# Patient Record
Sex: Female | Born: 1975 | Race: Black or African American | Hispanic: No | Smoking: Current every day smoker
Health system: Southern US, Community
[De-identification: ages and names within clinical notes are randomized; demographics above are authoritative.]

## PROBLEM LIST (undated history)

## (undated) DIAGNOSIS — Q270 Congenital absence and hypoplasia of umbilical artery: Secondary | ICD-10-CM

## (undated) DIAGNOSIS — I471 Supraventricular tachycardia, unspecified: Secondary | ICD-10-CM

## (undated) DIAGNOSIS — O24419 Gestational diabetes mellitus in pregnancy, unspecified control: Secondary | ICD-10-CM

## (undated) DIAGNOSIS — F1120 Opioid dependence, uncomplicated: Secondary | ICD-10-CM

## (undated) HISTORY — PX: NO PAST SURGERIES: SHX2092

## (undated) HISTORY — DX: Congenital absence and hypoplasia of umbilical artery: Q27.0

## (undated) HISTORY — DX: Gestational diabetes mellitus in pregnancy, unspecified control: O24.419

---

## 1999-01-21 ENCOUNTER — Emergency Department (HOSPITAL_COMMUNITY): Admission: EM | Admit: 1999-01-21 | Discharge: 1999-01-21 | Payer: Self-pay | Admitting: Emergency Medicine

## 1999-03-25 ENCOUNTER — Emergency Department (HOSPITAL_COMMUNITY): Admission: EM | Admit: 1999-03-25 | Discharge: 1999-03-25 | Payer: Self-pay | Admitting: Emergency Medicine

## 1999-03-25 ENCOUNTER — Encounter: Payer: Self-pay | Admitting: Emergency Medicine

## 1999-11-23 ENCOUNTER — Emergency Department (HOSPITAL_COMMUNITY): Admission: EM | Admit: 1999-11-23 | Discharge: 1999-11-23 | Payer: Self-pay | Admitting: Internal Medicine

## 2000-10-23 ENCOUNTER — Emergency Department (HOSPITAL_COMMUNITY): Admission: EM | Admit: 2000-10-23 | Discharge: 2000-10-23 | Payer: Self-pay | Admitting: Emergency Medicine

## 2000-10-23 ENCOUNTER — Encounter (INDEPENDENT_AMBULATORY_CARE_PROVIDER_SITE_OTHER): Payer: Self-pay

## 2000-10-23 ENCOUNTER — Encounter: Payer: Self-pay | Admitting: Emergency Medicine

## 2000-10-24 ENCOUNTER — Emergency Department (HOSPITAL_COMMUNITY): Admission: EM | Admit: 2000-10-24 | Discharge: 2000-10-24 | Payer: Self-pay | Admitting: Emergency Medicine

## 2000-10-28 ENCOUNTER — Ambulatory Visit (HOSPITAL_COMMUNITY): Admission: RE | Admit: 2000-10-28 | Discharge: 2000-10-28 | Payer: Self-pay | Admitting: Obstetrics

## 2000-10-28 ENCOUNTER — Encounter: Payer: Self-pay | Admitting: Obstetrics

## 2002-07-03 ENCOUNTER — Encounter: Payer: Self-pay | Admitting: Emergency Medicine

## 2002-07-03 ENCOUNTER — Emergency Department (HOSPITAL_COMMUNITY): Admission: EM | Admit: 2002-07-03 | Discharge: 2002-07-03 | Payer: Self-pay | Admitting: Emergency Medicine

## 2003-01-11 ENCOUNTER — Inpatient Hospital Stay (HOSPITAL_COMMUNITY): Admission: AD | Admit: 2003-01-11 | Discharge: 2003-01-11 | Payer: Self-pay | Admitting: Obstetrics

## 2003-03-12 ENCOUNTER — Inpatient Hospital Stay (HOSPITAL_COMMUNITY): Admission: AD | Admit: 2003-03-12 | Discharge: 2003-03-12 | Payer: Self-pay | Admitting: Obstetrics

## 2003-03-19 ENCOUNTER — Inpatient Hospital Stay (HOSPITAL_COMMUNITY): Admission: AD | Admit: 2003-03-19 | Discharge: 2003-03-21 | Payer: Self-pay | Admitting: Obstetrics

## 2003-11-22 ENCOUNTER — Emergency Department (HOSPITAL_COMMUNITY): Admission: EM | Admit: 2003-11-22 | Discharge: 2003-11-22 | Payer: Self-pay | Admitting: Emergency Medicine

## 2005-01-08 ENCOUNTER — Emergency Department (HOSPITAL_COMMUNITY): Admission: EM | Admit: 2005-01-08 | Discharge: 2005-01-08 | Payer: Self-pay | Admitting: Emergency Medicine

## 2005-09-27 ENCOUNTER — Emergency Department (HOSPITAL_COMMUNITY): Admission: EM | Admit: 2005-09-27 | Discharge: 2005-09-27 | Payer: Self-pay | Admitting: *Deleted

## 2006-05-03 ENCOUNTER — Emergency Department (HOSPITAL_COMMUNITY): Admission: EM | Admit: 2006-05-03 | Discharge: 2006-05-03 | Payer: Self-pay | Admitting: Emergency Medicine

## 2006-06-17 ENCOUNTER — Inpatient Hospital Stay (HOSPITAL_COMMUNITY): Admission: AD | Admit: 2006-06-17 | Discharge: 2006-06-17 | Payer: Self-pay | Admitting: Obstetrics

## 2006-06-22 ENCOUNTER — Inpatient Hospital Stay (HOSPITAL_COMMUNITY): Admission: AD | Admit: 2006-06-22 | Discharge: 2006-06-23 | Payer: Self-pay | Admitting: Obstetrics

## 2006-06-24 ENCOUNTER — Ambulatory Visit (HOSPITAL_COMMUNITY): Admission: RE | Admit: 2006-06-24 | Discharge: 2006-06-24 | Payer: Self-pay | Admitting: Obstetrics

## 2006-11-23 ENCOUNTER — Ambulatory Visit (HOSPITAL_COMMUNITY): Admission: RE | Admit: 2006-11-23 | Discharge: 2006-11-23 | Payer: Self-pay | Admitting: Obstetrics

## 2006-12-16 ENCOUNTER — Ambulatory Visit (HOSPITAL_COMMUNITY): Admission: RE | Admit: 2006-12-16 | Discharge: 2006-12-16 | Payer: Self-pay | Admitting: Obstetrics

## 2006-12-28 ENCOUNTER — Ambulatory Visit (HOSPITAL_COMMUNITY): Admission: RE | Admit: 2006-12-28 | Discharge: 2006-12-28 | Payer: Self-pay | Admitting: Obstetrics

## 2007-01-10 ENCOUNTER — Inpatient Hospital Stay (HOSPITAL_COMMUNITY): Admission: AD | Admit: 2007-01-10 | Discharge: 2007-01-10 | Payer: Self-pay | Admitting: Obstetrics

## 2007-03-03 ENCOUNTER — Inpatient Hospital Stay (HOSPITAL_COMMUNITY): Admission: AD | Admit: 2007-03-03 | Discharge: 2007-03-03 | Payer: Self-pay | Admitting: Obstetrics

## 2007-04-19 ENCOUNTER — Inpatient Hospital Stay (HOSPITAL_COMMUNITY): Admission: AD | Admit: 2007-04-19 | Discharge: 2007-04-19 | Payer: Self-pay | Admitting: Obstetrics

## 2007-05-07 ENCOUNTER — Inpatient Hospital Stay (HOSPITAL_COMMUNITY): Admission: AD | Admit: 2007-05-07 | Discharge: 2007-05-07 | Payer: Self-pay | Admitting: Obstetrics

## 2007-05-13 ENCOUNTER — Inpatient Hospital Stay (HOSPITAL_COMMUNITY): Admission: AD | Admit: 2007-05-13 | Discharge: 2007-05-13 | Payer: Self-pay | Admitting: Obstetrics

## 2007-05-24 ENCOUNTER — Inpatient Hospital Stay (HOSPITAL_COMMUNITY): Admission: AD | Admit: 2007-05-24 | Discharge: 2007-05-24 | Payer: Self-pay | Admitting: Obstetrics

## 2007-05-26 ENCOUNTER — Inpatient Hospital Stay (HOSPITAL_COMMUNITY): Admission: AD | Admit: 2007-05-26 | Discharge: 2007-05-26 | Payer: Self-pay | Admitting: Obstetrics

## 2007-05-28 ENCOUNTER — Inpatient Hospital Stay (HOSPITAL_COMMUNITY): Admission: AD | Admit: 2007-05-28 | Discharge: 2007-05-29 | Payer: Self-pay | Admitting: Obstetrics

## 2007-08-08 ENCOUNTER — Emergency Department (HOSPITAL_COMMUNITY): Admission: EM | Admit: 2007-08-08 | Discharge: 2007-08-08 | Payer: Self-pay | Admitting: Emergency Medicine

## 2007-09-26 ENCOUNTER — Emergency Department (HOSPITAL_COMMUNITY): Admission: EM | Admit: 2007-09-26 | Discharge: 2007-09-26 | Payer: Self-pay | Admitting: Emergency Medicine

## 2007-11-28 ENCOUNTER — Emergency Department (HOSPITAL_COMMUNITY): Admission: EM | Admit: 2007-11-28 | Discharge: 2007-11-28 | Payer: Self-pay | Admitting: Emergency Medicine

## 2008-01-10 ENCOUNTER — Emergency Department (HOSPITAL_COMMUNITY): Admission: EM | Admit: 2008-01-10 | Discharge: 2008-01-10 | Payer: Self-pay | Admitting: Family Medicine

## 2008-01-25 ENCOUNTER — Emergency Department (HOSPITAL_COMMUNITY): Admission: EM | Admit: 2008-01-25 | Discharge: 2008-01-25 | Payer: Self-pay | Admitting: Emergency Medicine

## 2008-03-04 ENCOUNTER — Emergency Department (HOSPITAL_COMMUNITY): Admission: EM | Admit: 2008-03-04 | Discharge: 2008-03-05 | Payer: Self-pay | Admitting: Emergency Medicine

## 2008-04-16 ENCOUNTER — Emergency Department (HOSPITAL_COMMUNITY): Admission: EM | Admit: 2008-04-16 | Discharge: 2008-04-16 | Payer: Self-pay | Admitting: Emergency Medicine

## 2008-08-12 ENCOUNTER — Emergency Department (HOSPITAL_COMMUNITY): Admission: EM | Admit: 2008-08-12 | Discharge: 2008-08-12 | Payer: Self-pay | Admitting: Emergency Medicine

## 2008-08-21 ENCOUNTER — Inpatient Hospital Stay (HOSPITAL_COMMUNITY): Admission: AD | Admit: 2008-08-21 | Discharge: 2008-08-21 | Payer: Self-pay | Admitting: Obstetrics

## 2008-10-04 ENCOUNTER — Emergency Department (HOSPITAL_COMMUNITY): Admission: EM | Admit: 2008-10-04 | Discharge: 2008-10-04 | Payer: Self-pay | Admitting: Emergency Medicine

## 2009-01-09 ENCOUNTER — Inpatient Hospital Stay (HOSPITAL_COMMUNITY): Admission: AD | Admit: 2009-01-09 | Discharge: 2009-01-10 | Payer: Self-pay | Admitting: Obstetrics

## 2009-01-22 ENCOUNTER — Inpatient Hospital Stay (HOSPITAL_COMMUNITY): Admission: AD | Admit: 2009-01-22 | Discharge: 2009-01-23 | Payer: Self-pay | Admitting: Obstetrics

## 2009-01-25 ENCOUNTER — Emergency Department (HOSPITAL_COMMUNITY): Admission: EM | Admit: 2009-01-25 | Discharge: 2009-01-25 | Payer: Self-pay | Admitting: Emergency Medicine

## 2009-04-08 ENCOUNTER — Emergency Department (HOSPITAL_COMMUNITY): Admission: EM | Admit: 2009-04-08 | Discharge: 2009-04-08 | Payer: Self-pay | Admitting: Emergency Medicine

## 2009-04-20 ENCOUNTER — Emergency Department (HOSPITAL_COMMUNITY): Admission: EM | Admit: 2009-04-20 | Discharge: 2009-04-20 | Payer: Self-pay | Admitting: Emergency Medicine

## 2009-06-02 ENCOUNTER — Emergency Department (HOSPITAL_COMMUNITY): Admission: EM | Admit: 2009-06-02 | Discharge: 2009-06-02 | Payer: Self-pay | Admitting: Family Medicine

## 2009-08-11 ENCOUNTER — Encounter: Admission: RE | Admit: 2009-08-11 | Discharge: 2009-08-11 | Payer: Self-pay | Admitting: Nephrology

## 2009-10-18 ENCOUNTER — Emergency Department (HOSPITAL_COMMUNITY): Admission: EM | Admit: 2009-10-18 | Discharge: 2009-10-18 | Payer: Self-pay | Admitting: Emergency Medicine

## 2010-02-03 IMAGING — CR DG FOOT COMPLETE 3+V*R*
3 series · 3 of 3 positions shown · non-contrast
Comparison: None

CLINICAL DATA: Fell down stairs - right foot pain

RIGHT FOOT COMPLETE - 3+ VIEW

[t foot ap right]
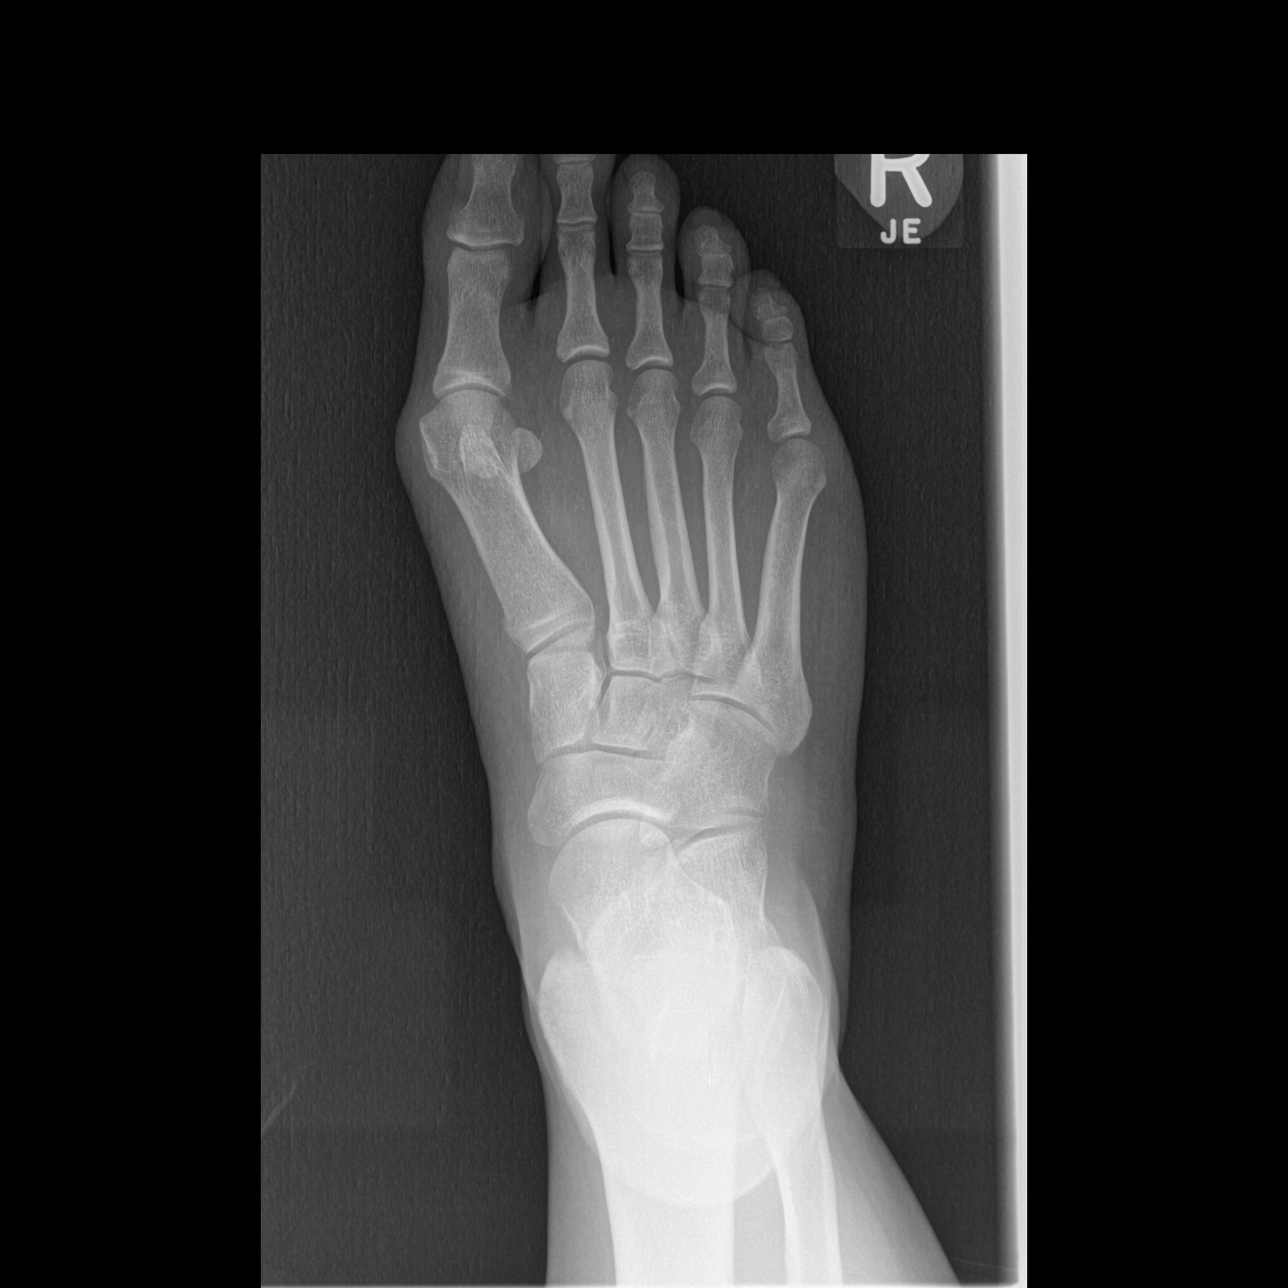

[t foot oblique right]
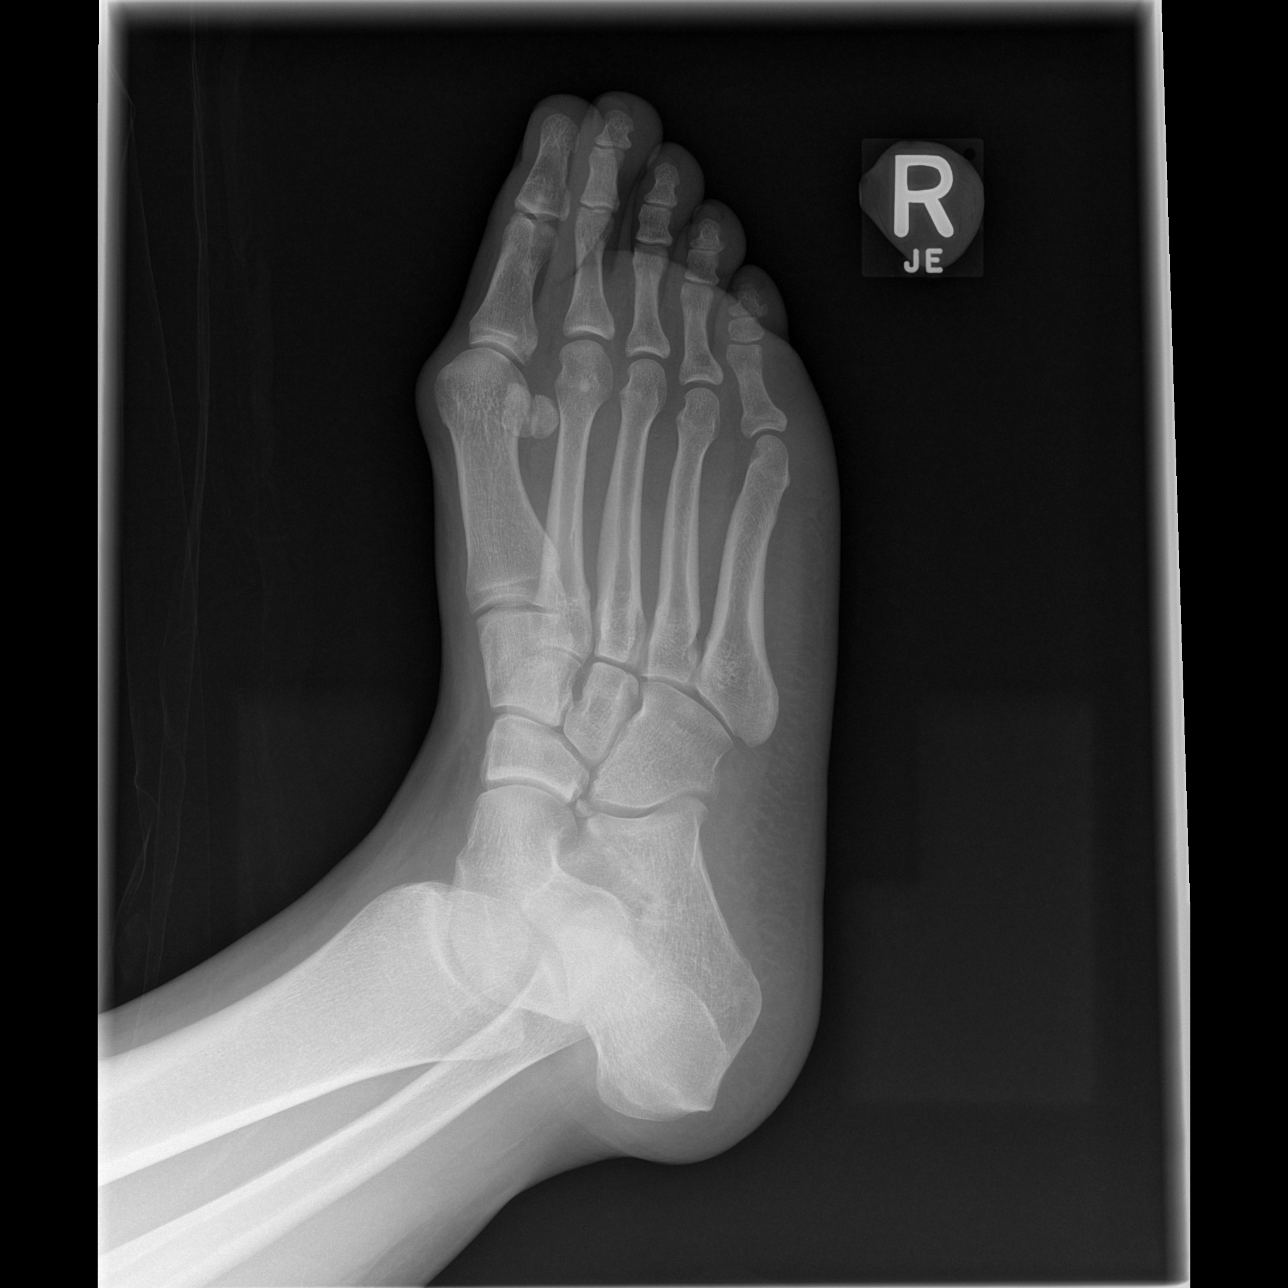

[t foot lat right]
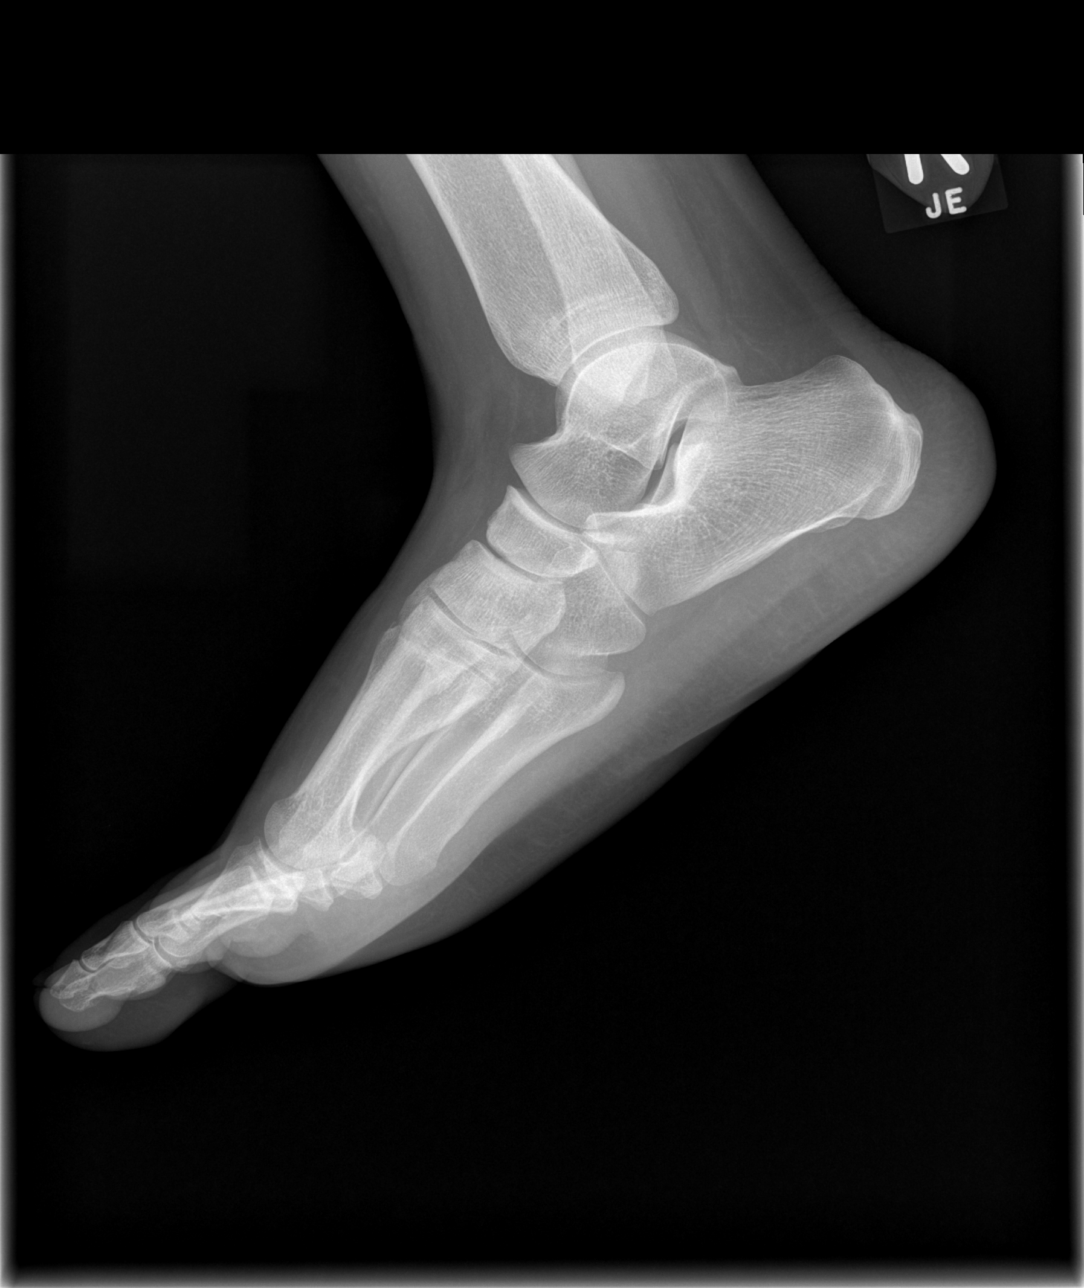

[3 of 3 positions shown; findings below may reference images not displayed]

FINDINGS: No acute abnormality.  There is a mild hallux valgus
deformity.  Soft tissues unremarkable.
IMPRESSION: No acute findings.

## 2010-07-29 ENCOUNTER — Emergency Department (HOSPITAL_COMMUNITY): Admission: EM | Admit: 2010-07-29 | Discharge: 2010-07-29 | Payer: Self-pay | Admitting: Emergency Medicine

## 2010-09-18 ENCOUNTER — Emergency Department (HOSPITAL_COMMUNITY): Admission: EM | Admit: 2010-09-18 | Discharge: 2010-09-18 | Payer: Self-pay | Admitting: Emergency Medicine

## 2011-01-10 ENCOUNTER — Encounter: Payer: Self-pay | Admitting: Obstetrics

## 2011-02-16 ENCOUNTER — Other Ambulatory Visit: Payer: Self-pay | Admitting: Family Medicine

## 2011-02-16 ENCOUNTER — Ambulatory Visit
Admission: RE | Admit: 2011-02-16 | Discharge: 2011-02-16 | Disposition: A | Discharge status: Home / Self Care | Payer: 59 | Source: Ambulatory Visit | Attending: Family Medicine | Admitting: Family Medicine

## 2011-02-16 DIAGNOSIS — J329 Chronic sinusitis, unspecified: Secondary | ICD-10-CM

## 2011-03-01 ENCOUNTER — Ambulatory Visit (HOSPITAL_BASED_OUTPATIENT_CLINIC_OR_DEPARTMENT_OTHER)
Admission: RE | Admit: 2011-03-01 | Discharge: 2011-03-01 | Disposition: A | Discharge status: Home / Self Care | Payer: 59 | Source: Ambulatory Visit | Attending: Otolaryngology | Admitting: Otolaryngology

## 2011-03-01 ENCOUNTER — Other Ambulatory Visit: Payer: Self-pay | Admitting: Otolaryngology

## 2011-03-01 DIAGNOSIS — F17200 Nicotine dependence, unspecified, uncomplicated: Secondary | ICD-10-CM | POA: Insufficient documentation

## 2011-03-01 DIAGNOSIS — J3489 Other specified disorders of nose and nasal sinuses: Secondary | ICD-10-CM | POA: Insufficient documentation

## 2011-03-01 DIAGNOSIS — F1410 Cocaine abuse, uncomplicated: Secondary | ICD-10-CM | POA: Insufficient documentation

## 2011-03-04 LAB — CBC
HCT: 40.3 % (ref 36.0–46.0)
Hemoglobin: 13.8 g/dL (ref 12.0–15.0)
MCH: 32.6 pg (ref 26.0–34.0)
MCHC: 34.2 g/dL (ref 30.0–36.0)
RDW: 15.1 % (ref 11.5–15.5)

## 2011-03-04 LAB — BASIC METABOLIC PANEL WITH GFR
BUN: 16 mg/dL (ref 6–23)
CO2: 29 meq/L (ref 19–32)
Calcium: 9.3 mg/dL (ref 8.4–10.5)
Chloride: 103 meq/L (ref 96–112)
Creatinine, Ser: 1.36 mg/dL — ABNORMAL HIGH (ref 0.4–1.2)
GFR calc non Af Amer: 45 mL/min — ABNORMAL LOW
Glucose, Bld: 96 mg/dL (ref 70–99)
Potassium: 3.5 meq/L (ref 3.5–5.1)
Sodium: 142 meq/L (ref 135–145)

## 2011-03-06 NOTE — Op Note (Signed)
  NAMESHAMRA, Yolanda Holmes                 ACCOUNT NO.:  [id number]  MEDICAL RECORD NO.:  [id number]           PATIENT TYPE:  LOCATION:                                 FACILITY:  PHYSICIAN:  Rito Lecomte H. Pollyann Kennedy, MD     DATE OF BIRTH:  12-Jan-1976  DATE OF PROCEDURE:  03/01/2011 DATE OF DISCHARGE:                              OPERATIVE REPORT   PREOPERATIVE DIAGNOSIS:  Chronic rhinitis and nasal necrosis.  POSTOPERATIVE DIAGNOSIS:  Chronic rhinitis and nasal necrosis.  PROCEDURE:  Nasal endoscopy with debridement of necrotic nasal tissue.  SURGEON:  Eliese Kerwood H. Pollyann Kennedy, MD  ANESTHESIA:  General endotracheal anesthesia was used.  COMPLICATIONS:  None.  FINDINGS:  Intense pallor of the entire nasal septum with large necrotic area of the septum, approximately 2 cm in diameter, involving the posterior quadrangular cartilage.  Behind that, there was a band of, what appeared to be, viable septum and posterior to that, there was another necrotic area with a perforation.  The inferior turbinate mucosa was completely necrosed as well.  The face of the middle turbinates was also partially necrotic and on the left side posteriorly, the nasal floor and the posterior inferior turbinate were also necrosed.  SPECIMEN TO PATHOLOGY:  Necrotic nasal septum.  BLOOD LOSS:  Minimal.  REFERRING PHYSICIAN:  Ephriam Knuckles Gurley  HISTORY:  This 35 year old went on a 7- to 20-month cocaine binge that ended in September.  Since then, she has had severe nasal and facial pain and chronic rhinitis and rhinorrhea.  Risks, benefits, alternatives, complications of the procedure were explained to the patient.  She seemed to understand and agreed to surgery.  PROCEDURE:  The patient was taken to the operating room, placed on the operative table in supine position.  Following induction of general endotracheal anesthesia, the face was prepped and draped in a standard fashion.  A 0-degree nasal endoscope was used to view the  nasal cavities bilaterally.  Suction was used to suction out debris and necrotic material.  The Wilde Blakesley forceps were used to remove large fragments of necrotic septum and other structures that were sent for pathologic evaluation.  The microdebrider was then used to clean up as much of the necrotic material as I was able to and to work my way back towards viable tissue with some actually bleeding tissue.  The above- mentioned findings were noted.  All of these areas were debrided.  A bulb syringe was then used to irrigate the saline through both sides of the nasal cavity. Fortunately, the nasal tip and supratip area still seemed to have adequate support, there was no saddle nose deformity.  The patient was then awakened, extubated, and transferred to recovery in stable condition.     [name] H. Pollyann Kennedy, MD     JHR/MEDQ  D:  03/01/2011  T:  03/02/2011  Job:  [id number]  cc:   Carolyne Fiscal  Electronically Signed by Serena Colonel MD on 03/06/2011 08:34:54 PM

## 2011-03-25 LAB — CBC
Hemoglobin: 15.8 g/dL — ABNORMAL HIGH (ref 12.0–15.0)
RBC: 4.89 MIL/uL (ref 3.87–5.11)
WBC: 12.8 10*3/uL — ABNORMAL HIGH (ref 4.0–10.5)

## 2011-03-25 LAB — DIFFERENTIAL
Eosinophils Absolute: 0.5 10*3/uL (ref 0.0–0.7)
Lymphs Abs: 3.5 10*3/uL (ref 0.7–4.0)
Monocytes Absolute: 1.1 10*3/uL — ABNORMAL HIGH (ref 0.1–1.0)
Monocytes Relative: 9 % (ref 3–12)
Neutrophils Relative %: 60 % (ref 43–77)

## 2011-03-25 LAB — CK TOTAL AND CKMB (NOT AT ARMC)
CK, MB: 2.2 ng/mL (ref 0.3–4.0)
Relative Index: 1.6 (ref 0.0–2.5)
Total CK: 134 U/L (ref 7–177)

## 2011-03-25 LAB — TROPONIN I: Troponin I: <0.01 ng/mL (ref 0.00–0.06)

## 2011-03-25 LAB — RAPID URINE DRUG SCREEN, HOSP PERFORMED
Amphetamines: NOT DETECTED
Benzodiazepines: NOT DETECTED
Tetrahydrocannabinol: NOT DETECTED

## 2011-03-25 LAB — COMPREHENSIVE METABOLIC PANEL
ALT: 31 U/L (ref 0–35)
Albumin: 4.2 g/dL (ref 3.5–5.2)
Calcium: 9.5 mg/dL (ref 8.4–10.5)
GFR calc Af Amer: >60 mL/min (ref >60)
Glucose, Bld: 120 mg/dL — ABNORMAL HIGH (ref 70–99)
Potassium: 4 mEq/L (ref 3.5–5.1)
Sodium: 137 mEq/L (ref 135–145)
Total Protein: 7.1 g/dL (ref 6.0–8.3)

## 2011-03-25 LAB — T4, FREE: Free T4: 1.57 ng/dL (ref 0.80–1.80)

## 2011-03-31 LAB — CBC
Hemoglobin: 15.1 g/dL — ABNORMAL HIGH (ref 12.0–15.0)
MCV: 97.4 fL (ref 78.0–100.0)
RBC: 4.56 MIL/uL (ref 3.87–5.11)
WBC: 8.5 10*3/uL (ref 4.0–10.5)

## 2011-03-31 LAB — COMPREHENSIVE METABOLIC PANEL
ALT: 20 U/L (ref 0–35)
AST: 17 U/L (ref 0–37)
CO2: 30 mEq/L (ref 19–32)
Chloride: 104 mEq/L (ref 96–112)
GFR calc Af Amer: >60 mL/min (ref >60)
GFR calc non Af Amer: >60 mL/min (ref >60)
Sodium: 140 mEq/L (ref 135–145)
Total Bilirubin: 0.2 mg/dL — ABNORMAL LOW (ref 0.3–1.2)

## 2011-03-31 LAB — DIFFERENTIAL
Basophils Absolute: 0 10*3/uL (ref 0.0–0.1)
Basophils Relative: 1 % (ref 0–1)
Eosinophils Absolute: 0.1 10*3/uL (ref 0.0–0.7)
Eosinophils Relative: 1 % (ref 0–5)

## 2011-03-31 LAB — RAPID URINE DRUG SCREEN, HOSP PERFORMED: Barbiturates: NOT DETECTED

## 2011-04-05 LAB — CBC
Hemoglobin: 13.1 g/dL (ref 12.0–15.0)
MCHC: 33.1 g/dL (ref 30.0–36.0)
MCV: 99.5 fL (ref 78.0–100.0)
RBC: 3.96 MIL/uL (ref 3.87–5.11)

## 2011-04-06 LAB — CBC
HCT: 38.6 % (ref 36.0–46.0)
Hemoglobin: 11.7 g/dL — ABNORMAL LOW (ref 12.0–15.0)
MCHC: 33.3 g/dL (ref 30.0–36.0)
MCV: 99.4 fL (ref 78.0–100.0)
Platelets: 276 10*3/uL (ref 150–400)
RDW: 13.4 % (ref 11.5–15.5)
WBC: 13.2 10*3/uL — ABNORMAL HIGH (ref 4.0–10.5)

## 2011-04-06 LAB — RPR: RPR Ser Ql: NONREACTIVE

## 2011-09-22 LAB — URINALYSIS, ROUTINE W REFLEX MICROSCOPIC
Bilirubin Urine: NEGATIVE
Hgb urine dipstick: NEGATIVE
Ketones, ur: 15 — AB
Protein, ur: NEGATIVE
Urobilinogen, UA: 0.2

## 2011-10-07 LAB — CBC
HCT: 36.1
Hemoglobin: 12.1
MCHC: 33.2
MCHC: 33.5
MCV: 93.4
MCV: 94.3
Platelets: 338
Platelets: 360
RBC: 3.87
RDW: 13.6
RDW: 13.6
WBC: 13.2 — ABNORMAL HIGH

## 2011-10-07 LAB — RPR
RPR Ser Ql: NONREACTIVE
RPR Ser Ql: NONREACTIVE

## 2012-01-23 ENCOUNTER — Other Ambulatory Visit: Payer: Self-pay

## 2012-01-23 ENCOUNTER — Encounter (HOSPITAL_COMMUNITY): Payer: Self-pay | Admitting: *Deleted

## 2012-01-23 ENCOUNTER — Emergency Department (HOSPITAL_COMMUNITY)
Admission: EM | Admit: 2012-01-23 | Discharge: 2012-01-23 | Disposition: A | Discharge status: Home / Self Care | Payer: Self-pay | Attending: Emergency Medicine | Admitting: Emergency Medicine

## 2012-01-23 DIAGNOSIS — I471 Supraventricular tachycardia, unspecified: Secondary | ICD-10-CM | POA: Insufficient documentation

## 2012-01-23 DIAGNOSIS — R42 Dizziness and giddiness: Secondary | ICD-10-CM | POA: Insufficient documentation

## 2012-01-23 DIAGNOSIS — F411 Generalized anxiety disorder: Secondary | ICD-10-CM | POA: Insufficient documentation

## 2012-01-23 DIAGNOSIS — F17200 Nicotine dependence, unspecified, uncomplicated: Secondary | ICD-10-CM | POA: Insufficient documentation

## 2012-01-23 DIAGNOSIS — R079 Chest pain, unspecified: Secondary | ICD-10-CM | POA: Insufficient documentation

## 2012-01-23 DIAGNOSIS — I498 Other specified cardiac arrhythmias: Secondary | ICD-10-CM | POA: Insufficient documentation

## 2012-01-23 DIAGNOSIS — R000 Tachycardia, unspecified: Secondary | ICD-10-CM

## 2012-01-23 HISTORY — DX: Supraventricular tachycardia: I47.1

## 2012-01-23 HISTORY — DX: Supraventricular tachycardia, unspecified: I47.10

## 2012-01-23 MED ORDER — ADENOSINE 6 MG/2ML IV SOLN
6.0000 mg | Freq: Once | INTRAVENOUS | Status: AC
  Administered 2012-01-23: 10:00:00 via INTRAVENOUS

## 2012-01-23 MED ORDER — METOPROLOL TARTRATE 25 MG PO TABS
50.0000 mg | ORAL_TABLET | Freq: Once | ORAL | Status: AC
  Administered 2012-01-23: 50 mg via ORAL
  Filled 2012-01-23: qty 2

## 2012-01-23 MED ORDER — SODIUM CHLORIDE 0.9 % IV SOLN: INTRAVENOUS | Status: DC

## 2012-01-23 MED ORDER — METOPROLOL SUCCINATE ER 25 MG PO TB24: 50.0000 mg | ORAL_TABLET | Freq: Every day | ORAL | Status: DC

## 2012-01-23 MED ORDER — ADENOSINE 6 MG/2ML IV SOLN
INTRAVENOUS | Status: AC
  Filled 2012-01-23: qty 6

## 2012-01-23 NOTE — ED Notes (Signed)
HR 236 at triage, hx of svt.

## 2012-01-23 NOTE — ED Provider Notes (Signed)
History     CSN: [id number]  Arrival date & time 01/23/12  0945   First MD Initiated Contact with Patient 01/23/12 1004      Chief Complaint  Patient presents with  . Irregular Heart Beat    (Consider location/radiation/quality/duration/timing/severity/associated sxs/prior treatment) The history is provided by the patient. The history is limited by the condition of the patient.   the patient is a 36 year old, female, with a history of PSVT.  She smokes cigarettes and drinks a.  When she had this morning.  She complains of sudden onset of rapid heartbeat, chest pain, lightheadedness, and shortness of breath.  She denies nausea, vomiting.  She has not had fevers, chills, or urinary tract symptoms, recently.  She tried to slow down her heart beat at home by himself but was unsuccessful so she came to the emergency room.  She is not taking any medications, and she denies allergies to medications.  Past Medical History  Diagnosis Date  . SVT (supraventricular tachycardia)     History reviewed. No pertinent past surgical history.  History reviewed. No pertinent family history.  History  Substance Use Topics  . Smoking status: Current Everyday Smoker    Types: Cigarettes  . Smokeless tobacco: Not on file  . Alcohol Use:     OB History    Grav Para Term Preterm Abortions TAB SAB Ect Mult Living                  Review of Systems  Unable to perform ROS   Allergies  Review of patient's allergies indicates no known allergies.  Home Medications  No current outpatient prescriptions on file.  BP 128/72  Pulse 239  Temp(Src) 98 F (36.7 C) (Oral)  Resp 24  SpO2 100%  Physical Exam  Constitutional: She is oriented to person, place, and time. She appears well-developed and well-nourished. She appears distressed.       Anxious and uncomfortable  HENT:  Head: Normocephalic and atraumatic.  Eyes: Pupils are equal, round, and reactive to light.  Neck: Normal range of motion.   Cardiovascular: Regular rhythm.   No murmur heard.      Tachycardia  Pulmonary/Chest: Effort normal and breath sounds normal.  Abdominal: She exhibits no distension.  Musculoskeletal: Normal range of motion. She exhibits no edema.  Neurological: She is alert and oriented to person, place, and time.  Skin: Skin is warm and dry.  Psychiatric: Her behavior is normal. Judgment and thought content normal.       Anxious    ED Course  Procedures (including critical care time) 36 year old, female, with known PSVT present PSVT, and a heart rate of 237.  We will establish an IV and they gave adenosine.  There are no indications for laboratory testing, and a chest x-ray.  This time  ED ECG REPORT   Date: 01/23/2012  EKG Time: 10:09 AM  Rate: 237  Rhythm: supraventricular tachycardia (SVT),   Axis: nl  Intervals:none  ST&T Change: nonspecific  Narrative Interpretation: psvt with nonspecific st changes  ED ECG REPORT   Date: 01/23/2012  EKG Time: 10:10 AM  Rate: 97  Rhythm: normal sinus rhythm,  normal EKG, normal sinus rhythm  Axis: nl  Intervals:none  ST&T Change: none  Narrative Interpretation: nsr following adenosine tx in ed                      Labs Reviewed - No data to display No results found.  No diagnosis found.  Feels better.  HR 110. Wants to go home.  It his her son's birthday.  MDM  psvt resolved in ed after tx with adenosine Rate controlled to sinus tach.  Advised pt to stop smoking and drinking red bull and other caffeinated beverages        Nicholes Stairs, MD 01/23/12 1044

## 2012-10-29 ENCOUNTER — Emergency Department (HOSPITAL_COMMUNITY)
Admission: EM | Admit: 2012-10-29 | Discharge: 2012-10-29 | Disposition: A | Discharge status: Home / Self Care | Payer: Medicaid Other | Source: Home / Self Care

## 2012-10-29 ENCOUNTER — Encounter (HOSPITAL_COMMUNITY): Payer: Self-pay | Admitting: *Deleted

## 2012-10-29 DIAGNOSIS — S058X9A Other injuries of unspecified eye and orbit, initial encounter: Secondary | ICD-10-CM

## 2012-10-29 DIAGNOSIS — S0502XA Injury of conjunctiva and corneal abrasion without foreign body, left eye, initial encounter: Secondary | ICD-10-CM

## 2012-10-29 HISTORY — DX: Opioid dependence, uncomplicated: F11.20

## 2012-10-29 MED ORDER — ERYTHROMYCIN 5 MG/GM OP OINT: TOPICAL_OINTMENT | Freq: Every day | OPHTHALMIC | Status: DC

## 2012-10-29 MED ORDER — TETRACAINE HCL 0.5 % OP SOLN
OPHTHALMIC | Status: AC
  Filled 2012-10-29: qty 2

## 2012-10-29 NOTE — ED Notes (Signed)
States woke up this morning with left eye watering and irritated.  As day has progressed, eye irritation is getting worse, and now she has blurred vision.  + photosensitivity with HA and nausea now.  Has not taken any meds - states she will not take any pills as she is a recovering opiate addict.  Does not wear contact lenses or glasses.

## 2012-10-29 NOTE — ED Provider Notes (Signed)
History     CSN: [id number]  Arrival date & time 10/29/12  1358   None     Chief Complaint  Patient presents with  . Eye Problem    (Consider location/radiation/quality/duration/timing/severity/associated sxs/prior treatment) HPI Comments: 36 year old female awoke this morning with a foreign body sensation in the left eye. Has been getting worse through the day and she now has blurring of vision occasionally doubling of vision states the left eye hurts especially on the upper lid. There is left eye tearing and a feeling of irritation. There is also no periorbital discomfort she describes as a headache type pain. No history of trauma.   Past Medical History  Diagnosis Date  . SVT (supraventricular tachycardia)     resolved since discontinuing opiate abuse  . Opiate addiction     History reviewed. No pertinent past surgical history.  No family history on file.  History  Substance Use Topics  . Smoking status: Current Every Day Smoker -- 1.0 packs/day    Types: Cigarettes  . Smokeless tobacco: Not on file  . Alcohol Use: No    OB History    Grav Para Term Preterm Abortions TAB SAB Ect Mult Living                  Review of Systems  Constitutional: Negative for fever, activity change and fatigue.  HENT: Negative for hearing loss, sore throat, facial swelling, rhinorrhea, neck pain, neck stiffness, postnasal drip and sinus pressure.   Eyes: Positive for photophobia, pain, discharge, redness and visual disturbance. Negative for itching.  Respiratory: Negative.   Genitourinary: Negative.     Allergies  Review of patient's allergies indicates no known allergies.  Home Medications   Current Outpatient Rx  Name  Route  Sig  Dispense  Refill  . ACETAMINOPHEN 500 MG PO TABS   Oral   Take 1,000 mg by mouth every 6 (six) hours as needed. For headache         . ERYTHROMYCIN 5 MG/GM OP OINT   Ophthalmic   Apply to eye at bedtime. Apply a 1/2 inch ribbon of ointment  to the lower lid of the L eye TID   3.5 g   0   . METOPROLOL SUCCINATE ER 25 MG PO TB24   Oral   Take 2 tablets (50 mg total) by mouth daily.   30 tablet   0     BP 125/75  Pulse 85  Temp 98.4 F (36.9 C) (Oral)  Resp 18  SpO2 100%  LMP 10/16/2012  Physical Exam  Constitutional: She is oriented to person, place, and time. She appears well-developed and well-nourished. No distress.  HENT:  Head: Normocephalic and atraumatic.  Mouth/Throat: Oropharynx is clear and moist.  Eyes: EOM are normal. Pupils are equal, round, and reactive to light.       No significant erythema of the sclera. The conjunctiva of upper and lower lid are erythematous with minor swelling of the upper lid. No particular lesion is seen externally or internally with lid everted. Funduscopic examination reveals normal blood vessels no hemorrhages or aneurysms, however, the optic disc was not well visualized.  Pulmonary/Chest: No respiratory distress.  Neurological: She is alert and oriented to person, place, and time. No cranial nerve deficit.  Skin: Skin is warm and dry.    ED Course  Irrigation Date/Time: 10/29/2012 3:15 PM Performed by: Phineas Real Zebastian Carico Authorized by: Phineas Real, Khadir Roam Consent: Verbal consent obtained. Consent given by: patient Patient understanding: patient states  understanding of the procedure being performed Patient identity confirmed: verbally with patient Local anesthesia used: yes Local anesthetic: topical anesthetic Anesthetic total: 0.02 ml Patient sedated: no Patient tolerance: Patient tolerated the procedure well with no immediate complications. Comments: After the left eye was anesthetized with 2 drops of tetracaine fluorescin dye was applied. In the eye was visualized with a blue light. A vertical and wide corneal abrasion is visible and is approximately 6 mm in length. The upper lid was everted and the lower lid lifted and no foreign body was visualized. The eye was irrigated with  90 cc of normal saline.   (including critical care time)  Labs Reviewed - No data to display No results found.   1. Corneal abrasion, left       MDM  Erythromycin ophthalmic ointment 1/2 inch ribbon of ointment applied to the left lower lid 3 times a day for 5 days. Do not use any more of the thick powder eyelash application. If not better in 2 days may return or call the ophthalmologist on call please see previous page for name and number. May return as needed          Hayden Rasmussen, NP 10/29/12 [phone]

## 2012-10-30 NOTE — ED Provider Notes (Signed)
Medical screening examination/treatment/procedure(s) were performed by resident physician or non-physician practitioner and as supervising physician I was immediately available for consultation/collaboration.   Montrice Montuori DOUGLAS MD.    Romeka Scifres D Toney Difatta, MD 10/30/12 1436 

## 2014-08-11 ENCOUNTER — Encounter (HOSPITAL_COMMUNITY): Payer: Self-pay | Admitting: Emergency Medicine

## 2014-08-11 ENCOUNTER — Emergency Department (HOSPITAL_COMMUNITY)
Admission: EM | Admit: 2014-08-11 | Discharge: 2014-08-11 | Disposition: A | Discharge status: Home / Self Care | Payer: Medicaid Other | Attending: Emergency Medicine | Admitting: Emergency Medicine

## 2014-08-11 DIAGNOSIS — T394X5A Adverse effect of antirheumatics, not elsewhere classified, initial encounter: Secondary | ICD-10-CM | POA: Insufficient documentation

## 2014-08-11 DIAGNOSIS — K2960 Other gastritis without bleeding: Secondary | ICD-10-CM | POA: Insufficient documentation

## 2014-08-11 DIAGNOSIS — M5430 Sciatica, unspecified side: Secondary | ICD-10-CM | POA: Insufficient documentation

## 2014-08-11 DIAGNOSIS — M5431 Sciatica, right side: Secondary | ICD-10-CM

## 2014-08-11 DIAGNOSIS — F17200 Nicotine dependence, unspecified, uncomplicated: Secondary | ICD-10-CM | POA: Insufficient documentation

## 2014-08-11 DIAGNOSIS — I498 Other specified cardiac arrhythmias: Secondary | ICD-10-CM | POA: Insufficient documentation

## 2014-08-11 DIAGNOSIS — Z791 Long term (current) use of non-steroidal anti-inflammatories (NSAID): Secondary | ICD-10-CM | POA: Insufficient documentation

## 2014-08-11 DIAGNOSIS — Z79899 Other long term (current) drug therapy: Secondary | ICD-10-CM | POA: Insufficient documentation

## 2014-08-11 DIAGNOSIS — T39395A Adverse effect of other nonsteroidal anti-inflammatory drugs [NSAID], initial encounter: Secondary | ICD-10-CM

## 2014-08-11 DIAGNOSIS — R1013 Epigastric pain: Secondary | ICD-10-CM | POA: Insufficient documentation

## 2014-08-11 LAB — COMPREHENSIVE METABOLIC PANEL
ALBUMIN: 3.7 g/dL (ref 3.5–5.2)
ALK PHOS: 74 U/L (ref 39–117)
ALT: 38 U/L — ABNORMAL HIGH (ref 0–35)
ANION GAP: 11 (ref 5–15)
AST: 26 U/L (ref 0–37)
BILIRUBIN TOTAL: 0.2 mg/dL — AB (ref 0.3–1.2)
BUN: 15 mg/dL (ref 6–23)
CHLORIDE: 107 meq/L (ref 96–112)
CO2: 26 meq/L (ref 19–32)
Calcium: 9.6 mg/dL (ref 8.4–10.5)
Creatinine, Ser: 0.77 mg/dL (ref 0.50–1.10)
GFR calc Af Amer: >90 mL/min (ref >90)
Glucose, Bld: 119 mg/dL — ABNORMAL HIGH (ref 70–99)
POTASSIUM: 4.9 meq/L (ref 3.7–5.3)
Sodium: 144 mEq/L (ref 137–147)
Total Protein: 7.2 g/dL (ref 6.0–8.3)

## 2014-08-11 LAB — CBC WITH DIFFERENTIAL/PLATELET
BASOS PCT: 1 % (ref 0–1)
Basophils Absolute: 0.1 10*3/uL (ref 0.0–0.1)
Eosinophils Absolute: 0.2 10*3/uL (ref 0.0–0.7)
Eosinophils Relative: 3 % (ref 0–5)
HEMATOCRIT: 37.8 % (ref 36.0–46.0)
HEMOGLOBIN: 12.7 g/dL (ref 12.0–15.0)
LYMPHS ABS: 3 10*3/uL (ref 0.7–4.0)
LYMPHS PCT: 38 % (ref 12–46)
MCH: 30.2 pg (ref 26.0–34.0)
MCHC: 33.6 g/dL (ref 30.0–36.0)
MCV: 90 fL (ref 78.0–100.0)
MONOS PCT: 10 % (ref 3–12)
Monocytes Absolute: 0.8 10*3/uL (ref 0.1–1.0)
NEUTROS ABS: 3.9 10*3/uL (ref 1.7–7.7)
NEUTROS PCT: 48 % (ref 43–77)
Platelets: 340 10*3/uL (ref 150–400)
RBC: 4.2 MIL/uL (ref 3.87–5.11)
RDW: 14.1 % (ref 11.5–15.5)
WBC: 7.9 10*3/uL (ref 4.0–10.5)

## 2014-08-11 LAB — URINALYSIS, ROUTINE W REFLEX MICROSCOPIC
BILIRUBIN URINE: NEGATIVE
Glucose, UA: NEGATIVE mg/dL
HGB URINE DIPSTICK: NEGATIVE
Ketones, ur: NEGATIVE mg/dL
Leukocytes, UA: NEGATIVE
Nitrite: NEGATIVE
PH: 5.5 (ref 5.0–8.0)
Protein, ur: NEGATIVE mg/dL
SPECIFIC GRAVITY, URINE: 1.005 (ref 1.005–1.030)
UROBILINOGEN UA: 0.2 mg/dL (ref 0.0–1.0)

## 2014-08-11 LAB — LIPASE, BLOOD: Lipase: 33 U/L (ref 11–59)

## 2014-08-11 MED ORDER — PANTOPRAZOLE SODIUM 40 MG IV SOLR: 40.0000 mg | Freq: Once | INTRAVENOUS | Status: DC

## 2014-08-11 MED ORDER — OMEPRAZOLE 20 MG PO CPDR: 20.0000 mg | DELAYED_RELEASE_CAPSULE | Freq: Every day | ORAL | Status: DC

## 2014-08-11 MED ORDER — GI COCKTAIL ~~LOC~~
30.0000 mL | Freq: Once | ORAL | Status: AC
  Administered 2014-08-11: 30 mL via ORAL
  Filled 2014-08-11: qty 30

## 2014-08-11 MED ORDER — PREDNISONE 20 MG PO TABS: ORAL_TABLET | ORAL | Status: DC

## 2014-08-11 NOTE — Discharge Instructions (Signed)
Sciatica °Sciatica is pain, weakness, numbness, or tingling along the path of the sciatic nerve. The nerve starts in the lower back and runs down the back of each leg. The nerve controls the muscles in the lower leg and in the back of the knee, while also providing sensation to the back of the thigh, lower leg, and the sole of your foot. Sciatica is a symptom of another medical condition. For instance, nerve damage or certain conditions, such as a herniated disk or bone spur on the spine, pinch or put pressure on the sciatic nerve. This causes the pain, weakness, or other sensations normally associated with sciatica. Generally, sciatica only affects one side of the body. °CAUSES  °· Herniated or slipped disc. °· Degenerative disk disease. °· A pain disorder involving the narrow muscle in the buttocks (piriformis syndrome). °· Pelvic injury or fracture. °· Pregnancy. °· Tumor (rare). °SYMPTOMS  °Symptoms can vary from mild to very severe. The symptoms usually travel from the low back to the buttocks and down the back of the leg. Symptoms can include: °· Mild tingling or dull aches in the lower back, leg, or hip. °· Numbness in the back of the calf or sole of the foot. °· Burning sensations in the lower back, leg, or hip. °· Sharp pains in the lower back, leg, or hip. °· Leg weakness. °· Severe back pain inhibiting movement. °These symptoms may get worse with coughing, sneezing, laughing, or prolonged sitting or standing. Also, being overweight may worsen symptoms. °DIAGNOSIS  °Your caregiver will perform a physical exam to look for common symptoms of sciatica. He or she may ask you to do certain movements or activities that would trigger sciatic nerve pain. Other tests may be performed to find the cause of the sciatica. These may include: °· Blood tests. °· X-rays. °· Imaging tests, such as an MRI or CT scan. °TREATMENT  °Treatment is directed at the cause of the sciatic pain. Sometimes, treatment is not necessary  and the pain and discomfort goes away on its own. If treatment is needed, your caregiver may suggest: °· Over-the-counter medicines to relieve pain. °· Prescription medicines, such as anti-inflammatory medicine, muscle relaxants, or narcotics. °· Applying heat or ice to the painful area. °· Steroid injections to lessen pain, irritation, and inflammation around the nerve. °· Reducing activity during periods of pain. °· Exercising and stretching to strengthen your abdomen and improve flexibility of your spine. Your caregiver may suggest losing weight if the extra weight makes the back pain worse. °· Physical therapy. °· Surgery to eliminate what is pressing or pinching the nerve, such as a bone spur or part of a herniated disk. °HOME CARE INSTRUCTIONS  °· Only take over-the-counter or prescription medicines for pain or discomfort as directed by your caregiver. °· Apply ice to the affected area for 20 minutes, 3-4 times a day for the first 48-72 hours. Then try heat in the same way. °· Exercise, stretch, or perform your usual activities if these do not aggravate your pain. °· Attend physical therapy sessions as directed by your caregiver. °· Keep all follow-up appointments as directed by your caregiver. °· Do not wear high heels or shoes that do not provide proper support. °· Check your mattress to see if it is too soft. A firm mattress may lessen your pain and discomfort. °SEEK IMMEDIATE MEDICAL CARE IF:  °· You lose control of your bowel or bladder (incontinence). °· You have increasing weakness in the lower back, pelvis, buttocks,   or legs.  You have redness or swelling of your back.  You have a burning sensation when you urinate.  You have pain that gets worse when you lie down or awakens you at night.  Your pain is worse than you have experienced in the past.  Your pain is lasting longer than 4 weeks.  You are suddenly losing weight without reason. MAKE SURE YOU:  Understand these  instructions.  Will watch your condition.  Will get help right away if you are not doing well or get worse. Document Released: 11/30/2001 Document Revised: 06/06/2012 Document Reviewed: 04/16/2012 [facility] Patient Information 2015 [place], [place]. This information is not intended to replace advice given to you by your health care provider. Make sure you discuss any questions you have with your health care provider.   Gastritis, Adult Gastritis is soreness and swelling (inflammation) of the lining of the stomach. Gastritis can develop as a sudden onset (acute) or long-term (chronic) condition. If gastritis is not treated, it can lead to stomach bleeding and ulcers. CAUSES  Gastritis occurs when the stomach lining is weak or damaged. Digestive juices from the stomach then inflame the weakened stomach lining. The stomach lining may be weak or damaged due to viral or bacterial infections. One common bacterial infection is the Helicobacter pylori infection. Gastritis can also result from excessive alcohol consumption, taking certain medicines, or having too much acid in the stomach.  SYMPTOMS  In some cases, there are no symptoms. When symptoms are present, they may include:  Pain or a burning sensation in the upper abdomen.  Nausea.  Vomiting.  An uncomfortable feeling of fullness after eating. DIAGNOSIS  Your caregiver may suspect you have gastritis based on your symptoms and a physical exam. To determine the cause of your gastritis, your caregiver may perform the following:  Blood or stool tests to check for the H pylori bacterium.  Gastroscopy. A thin, flexible tube (endoscope) is passed down the esophagus and into the stomach. The endoscope has a light and camera on the end. Your caregiver uses the endoscope to view the inside of the stomach.  Taking a tissue sample (biopsy) from the stomach to examine under a microscope. TREATMENT  Depending on the cause of your gastritis, medicines  may be prescribed. If you have a bacterial infection, such as an H pylori infection, antibiotics may be given. If your gastritis is caused by too much acid in the stomach, H2 blockers or antacids may be given. Your caregiver may recommend that you stop taking aspirin, ibuprofen, or other nonsteroidal anti-inflammatory drugs (NSAIDs). HOME CARE INSTRUCTIONS  Only take over-the-counter or prescription medicines as directed by your caregiver.  If you were given antibiotic medicines, take them as directed. Finish them even if you start to feel better.  Drink enough fluids to keep your urine clear or pale yellow.  Avoid foods and drinks that make your symptoms worse, such as:  Caffeine or alcoholic drinks.  Chocolate.  Peppermint or mint flavorings.  Garlic and onions.  Spicy foods.  Citrus fruits, such as oranges, lemons, or limes.  Tomato-based foods such as sauce, chili, salsa, and pizza.  Fried and fatty foods.  Eat small, frequent meals instead of large meals. SEEK IMMEDIATE MEDICAL CARE IF:   You have black or dark red stools.  You vomit blood or material that looks like coffee grounds.  You are unable to keep fluids down.  Your abdominal pain gets worse.  You have a fever.  You do not  feel better after 1 week.  You have any other questions or concerns. MAKE SURE YOU:  Understand these instructions.  Will watch your condition.  Will get help right away if you are not doing well or get worse. Document Released: 11/30/2001 Document Revised: 06/06/2012 Document Reviewed: 01/19/2012 [facility] Patient Information 2015 [place], [place]. This information is not intended to replace advice given to you by your health care provider. Make sure you discuss any questions you have with your health care provider.

## 2014-08-11 NOTE — ED Provider Notes (Signed)
CSN: [id number]     Arrival date & time 08/11/14  0865 History   First MD Initiated Contact with Patient 08/11/14 [phone]     Chief Complaint  Patient presents with  . Flank Pain     (Consider location/radiation/quality/duration/timing/severity/associated sxs/prior Treatment) HPI Yolanda Holmes is a 38 year old female with past medical history of an opiate addiction who presents to the ER today complaining of back pain. Patient states her pain was acute in onset, and she states she recalls "waking up one morning with the pain". She denies trauma to the area.  Patient states her pain is in her lower lumbar region and radiates to her right gluteal region. Patient states the pain is a sharp pain which is intermittent, is made worse with walking, movement. Patient states when moving around her pain is approximately a 6/10, however when sitting still her pain is a 0/10. Patient states she has tried taking 3 ibuprofen in 3 only use every morning for her pain. Patient denies numbness, tingling, saddle anesthesia, weakness, fever, history of cancer, history of IV drug use. Patient states her opiate addiction was involving prescription narcotics.  Past Medical History  Diagnosis Date  . SVT (supraventricular tachycardia)     resolved since discontinuing opiate abuse  . Opiate addiction    No past surgical history on file. No family history on file. History  Substance Use Topics  . Smoking status: Current Every Day Smoker -- 1.00 packs/day    Types: Cigarettes  . Smokeless tobacco: Not on file  . Alcohol Use: No   OB History   Grav Para Term Preterm Abortions TAB SAB Ect Mult Living                 Review of Systems  Constitutional: Negative for fever.  HENT: Negative for trouble swallowing.   Eyes: Negative for visual disturbance.  Respiratory: Negative for shortness of breath.   Cardiovascular: Negative for chest pain.  Gastrointestinal: Negative for nausea, vomiting and abdominal pain.   Endocrine: Negative for polyuria.  Genitourinary: Negative for dysuria, urgency, frequency, difficulty urinating and pelvic pain.  Musculoskeletal: Positive for back pain. Negative for arthralgias, gait problem, joint swelling, myalgias and neck pain.  Skin: Negative for rash.  Neurological: Negative for dizziness, weakness and numbness.  Psychiatric/Behavioral: Negative.       Allergies  Review of patient's allergies indicates no known allergies.  Home Medications   Prior to Admission medications   Medication Sig Start Date End Date Taking? Authorizing Provider  ibuprofen (ADVIL,MOTRIN) 200 MG tablet Take 600 mg by mouth every 6 (six) hours as needed for moderate pain.   Yes Historical Provider, MD  naproxen sodium (ANAPROX) 220 MG tablet Take 220 mg by mouth 2 (two) times daily with a meal.   Yes Historical Provider, MD  metoprolol succinate (TOPROL-XL) 25 MG 24 hr tablet Take 50 mg by mouth daily.    Historical Provider, MD  omeprazole (PRILOSEC) 20 MG capsule Take 1 capsule (20 mg total) by mouth daily. 08/11/14   Monte Fantasia, PA-C  predniSONE (DELTASONE) 20 MG tablet 3 tabs po day one, then 2 po daily x 4 days 08/11/14   Monte Fantasia, PA-C   BP 132/86  Pulse 41  Temp(Src) 97.9 F (36.6 C) (Oral)  Resp 18  SpO2 100%  LMP 08/07/2014 Physical Exam  Constitutional: She is oriented to person, place, and time. She appears well-developed and well-nourished. No distress.  HENT:  Head: Normocephalic and atraumatic.  Mouth/Throat: Oropharynx  is clear and moist. No oropharyngeal exudate.  Eyes: Right eye exhibits no discharge. Left eye exhibits no discharge. No scleral icterus.  Neck: Normal range of motion.  Cardiovascular: Normal rate, regular rhythm and normal heart sounds.   No murmur heard. Pulmonary/Chest: Effort normal and breath sounds normal. No respiratory distress.  Abdominal: Soft. Normal appearance and bowel sounds are normal. There is tenderness in the epigastric  area.  Musculoskeletal: Normal range of motion. She exhibits no edema and no tenderness.  Patient has tenderness from her L3 to her S1/S2 region. No obvious deformities, step off, erythema, ecchymosis, trauma noted to the region.    Neurological: She is alert and oriented to person, place, and time. She has normal strength and normal reflexes. No cranial nerve deficit or sensory deficit. She displays a negative Romberg sign. Coordination and gait normal.  Motor strength 5 out of 5 in all major muscle groups of upper and lower extremities. Straight leg raise positive on right lower, negative on the left lower extremity.   Skin: Skin is warm and dry. No rash noted. She is not diaphoretic.  Psychiatric: She has a normal mood and affect.    ED Course  Procedures (including critical care time) Labs Review Labs Reviewed  COMPREHENSIVE METABOLIC PANEL - Abnormal; Notable for the following:    Glucose, Bld 119 (*)    ALT 38 (*)    Total Bilirubin 0.2 (*)    All other components within normal limits  URINE CULTURE  CBC WITH DIFFERENTIAL  LIPASE, BLOOD  URINALYSIS, ROUTINE W REFLEX MICROSCOPIC    Imaging Review No results found.   EKG Interpretation None      MDM   Final diagnoses:  Sciatica associated with disorder of lumbar spine, right  Gastritis due to nonsteroidal anti-inflammatory drug (NSAID)   38 year old female here with 6 weeks of worsening back pain with signs and symptoms of radiculopathy to her right buttock region concerning for sciatic pain. Patient stating she is taking 3 ibuprofen along with 3 Aleve every morning before she goes to work for her pain. She states this medicine helps, however the pain returns and she does not use any other medicine throughout the day. Physical exam consistent with a lumbar radiculopathy. Also note patient was extremely tender in her epigastric region. Patient initially denies knowing about this abdominal pain, however speaking with her more  extensively she states his abdominal pain has been present approximately the same time that her back pain has been present. She states she was hesitant to bring upper abdominal pain due to the fact that she has had a history of opioid dependence and does not wish to be prescribed any opiate medications for pain. She states she is hesitant about having any workup due to the fact that she is afraid of relapsing into chemical dependence. I  offered patient a GI cocktail to determine if her abdominal pain was coming from and excessive use of NSAIDs and advised her that this should not affect her tendency for dependence on medication. Further workup with CBC, CMP, UA, lipase, urine culture.  12:20 PM: Patient's pain down to a 0/10 in her abdomen after GI cocktail with no tenderness noted on reexamination. Labs unremarkable at this point. Due to the fact the patient's pain her abdomen was held with the GI cocktail and labs are unremarkable , we will discharge patient at this point. I spoke to patient at length about proper use of ibuprofen, and how this may have been causing  some gastritis symptoms. We'll give patient a PPI to take daily, and a steroid Dosepak to help patient back pain. I also educated her on use of RICE. I strongly encouraged patient find a PCP in the area, and patient agreed that this would be a good plan to help followup and insure resolution of her symptoms in the near future. I encouraged patient to call or return to the ER should she have any questions or concerns.   Signed,  Ladona Mow, PA-C 5:21 PM   This patient seen and discussed with Dr. Toy Cookey, M.D.  Monte Fantasia, PA-C 08/11/14 1722

## 2014-08-11 NOTE — ED Notes (Signed)
She c/o right lat. Thoracic/low back/flank area pain x 6-8 weeks.  She is in no distress.

## 2014-08-12 LAB — URINE CULTURE

## 2014-08-13 NOTE — ED Provider Notes (Signed)
Medical screening examination/treatment/procedure(s) were conducted as a shared visit with non-physician practitioner(s) and myself.  I personally evaluated the patient during the encounter. Pt presents w/ R low back/SI joint pain worsen w/ mvmt, better when still. She has been taking large amts of NSAIDs for this. No numbness, weakness, fevers, bowel or bladder incontinence. She had epigastric & RUQ ttp on abdominal exam w/o rebound or guarding, but had not been complaining of ab pain. Cardiopulm exam benign. Screening labs unremarkable. Suspect gastritis due to large amts of NSAIDs. Pain resolved after GI cocktail. Will place on PPI, short course of steroids and instruct on more appropriate use of NSAIDS. Opiates will not be given due to hx of addiction.   EKG Interpretation None        Toy Cookey, MD 08/13/14 1133

## 2015-08-15 ENCOUNTER — Emergency Department (HOSPITAL_COMMUNITY): Payer: Medicaid Other

## 2015-08-15 ENCOUNTER — Emergency Department (HOSPITAL_COMMUNITY)
Admission: EM | Admit: 2015-08-15 | Discharge: 2015-08-15 | Disposition: A | Discharge status: Home / Self Care | Payer: Medicaid Other | Attending: Emergency Medicine | Admitting: Emergency Medicine

## 2015-08-15 ENCOUNTER — Encounter (HOSPITAL_COMMUNITY): Payer: Self-pay | Admitting: *Deleted

## 2015-08-15 DIAGNOSIS — Z8639 Personal history of other endocrine, nutritional and metabolic disease: Secondary | ICD-10-CM | POA: Insufficient documentation

## 2015-08-15 DIAGNOSIS — Z720 Tobacco use: Secondary | ICD-10-CM | POA: Insufficient documentation

## 2015-08-15 DIAGNOSIS — M10072 Idiopathic gout, left ankle and foot: Secondary | ICD-10-CM | POA: Insufficient documentation

## 2015-08-15 DIAGNOSIS — Z79899 Other long term (current) drug therapy: Secondary | ICD-10-CM | POA: Insufficient documentation

## 2015-08-15 DIAGNOSIS — M109 Gout, unspecified: Secondary | ICD-10-CM

## 2015-08-15 MED ORDER — IBUPROFEN 800 MG PO TABS: 800.0000 mg | ORAL_TABLET | Freq: Three times a day (TID) | ORAL | Status: DC | PRN

## 2015-08-15 MED ORDER — PREDNISONE 50 MG PO TABS: 50.0000 mg | ORAL_TABLET | Freq: Every day | ORAL | Status: DC

## 2015-08-15 NOTE — ED Notes (Signed)
Pt reports pain and swelling to left big toe x 1 week, denies injury. Ambulatory at triage.

## 2015-08-15 NOTE — ED Provider Notes (Signed)
CSN: [id number]     Arrival date & time 08/15/15  1347 History  This chart was scribed for non-physician practitioner Ebbie Ridge, PA-C working with Gwyneth Sprout, MD by Lyndel Safe, ED Scribe. This patient was seen in room TR06C/TR06C and the patient's care was started at 2:18 PM.    Chief Complaint  Patient presents with  . Foot Pain   The history is provided by the patient. No language interpreter was used.    HPI Comments: Yolanda Holmes is a 39 y.o. female, with no significant PMhx, who presents to the Emergency Department complaining of constant, moderate pain to the medial aspect of left foot onset 8 days ago. Pt does not note an injury attributable to her pain but states she stands for the majority of her workday. Her pain is exacerbated with weight bearing and pressure to the area. Pt has applied ice and heat to the affected area with no relief. Denies pain to the sole of her foot or any wound to the area.   Past Medical History  Diagnosis Date  . SVT (supraventricular tachycardia)     resolved since discontinuing opiate abuse  . Opiate addiction    History reviewed. No pertinent past surgical history. History reviewed. No pertinent family history. Social History  Substance Use Topics  . Smoking status: Current Every Day Smoker -- 1.00 packs/day    Types: Cigarettes  . Smokeless tobacco: None  . Alcohol Use: No   OB History    No data available     Review of Systems  Musculoskeletal: Positive for arthralgias ( left foot).  Skin: Negative for wound.  All other systems reviewed and are negative.  Allergies  Review of patient's allergies indicates no known allergies.  Home Medications   Prior to Admission medications   Medication Sig Start Date End Date Taking? Authorizing Provider  ibuprofen (ADVIL,MOTRIN) 200 MG tablet Take 600 mg by mouth every 6 (six) hours as needed for moderate pain.    Historical Provider, MD  metoprolol succinate (TOPROL-XL) 25 MG 24 hr  tablet Take 50 mg by mouth daily.    Historical Provider, MD  naproxen sodium (ANAPROX) 220 MG tablet Take 220 mg by mouth 2 (two) times daily with a meal.    Historical Provider, MD  omeprazole (PRILOSEC) 20 MG capsule Take 1 capsule (20 mg total) by mouth daily. 08/11/14   Ladona Mow, PA-C  predniSONE (DELTASONE) 20 MG tablet 3 tabs po day one, then 2 po daily x 4 days 08/11/14   Ladona Mow, PA-C   BP 118/80 mmHg  Pulse 92  Temp(Src) 98.6 F (37 C) (Oral)  Resp 18  SpO2 98%  LMP 07/25/2015 Physical Exam  Constitutional: She is oriented to person, place, and time. She appears well-developed and well-nourished.  HENT:  Head: Normocephalic and atraumatic.  Eyes: Pupils are equal, round, and reactive to light.  Pulmonary/Chest: Effort normal.  Musculoskeletal:       Left foot: There is tenderness.       Feet:  Neurological: She is alert and oriented to person, place, and time.  Skin: Skin is warm and dry. No erythema.  Nursing note and vitals reviewed.   ED Course  Procedures  DIAGNOSTIC STUDIES: Oxygen Saturation is 98% on RA, normal by my interpretation.    COORDINATION OF CARE: 2:22 PM Discussed treatment plan which includes to order Xray imaging of left foot with pt. Will give orthopedic referral and advised pt to ice affected area. Pt acknowledges and  agrees to plan.   Labs Review Labs Reviewed - No data to display  Imaging Review No results found. I have personally reviewed and evaluated these images and lab results as part of my medical decision-making. I feel that the patient has gout and she will need follow-up with orthopedics.  Patient agrees the plan and all questions were answered I personally performed the services described in this documentation, which was scribed in my presence. The recorded information has been reviewed and is accurate.    Charlestine Night, PA-C 08/18/15 1610  Gwyneth Sprout, MD 08/18/15 1700

## 2015-08-15 NOTE — Discharge Instructions (Signed)
Return here as needed.  Follow-up with the clinic provided.  Ice and elevate your foot

## 2015-09-13 ENCOUNTER — Emergency Department (HOSPITAL_COMMUNITY): Payer: Medicaid Other

## 2015-09-13 ENCOUNTER — Encounter (HOSPITAL_COMMUNITY): Payer: Self-pay

## 2015-09-13 ENCOUNTER — Emergency Department (HOSPITAL_COMMUNITY)
Admission: EM | Admit: 2015-09-13 | Discharge: 2015-09-13 | Disposition: A | Discharge status: Home / Self Care | Payer: Medicaid Other | Attending: Emergency Medicine | Admitting: Emergency Medicine

## 2015-09-13 DIAGNOSIS — Z720 Tobacco use: Secondary | ICD-10-CM | POA: Insufficient documentation

## 2015-09-13 DIAGNOSIS — J069 Acute upper respiratory infection, unspecified: Secondary | ICD-10-CM | POA: Insufficient documentation

## 2015-09-13 DIAGNOSIS — Z791 Long term (current) use of non-steroidal anti-inflammatories (NSAID): Secondary | ICD-10-CM | POA: Insufficient documentation

## 2015-09-13 DIAGNOSIS — Z3202 Encounter for pregnancy test, result negative: Secondary | ICD-10-CM | POA: Insufficient documentation

## 2015-09-13 DIAGNOSIS — H9202 Otalgia, left ear: Secondary | ICD-10-CM | POA: Insufficient documentation

## 2015-09-13 DIAGNOSIS — R1110 Vomiting, unspecified: Secondary | ICD-10-CM | POA: Insufficient documentation

## 2015-09-13 DIAGNOSIS — Z7952 Long term (current) use of systemic steroids: Secondary | ICD-10-CM | POA: Insufficient documentation

## 2015-09-13 DIAGNOSIS — J40 Bronchitis, not specified as acute or chronic: Secondary | ICD-10-CM

## 2015-09-13 DIAGNOSIS — Z79899 Other long term (current) drug therapy: Secondary | ICD-10-CM | POA: Insufficient documentation

## 2015-09-13 LAB — POC URINE PREG, ED: PREG TEST UR: NEGATIVE

## 2015-09-13 MED ORDER — BENZONATATE 100 MG PO CAPS
ORAL_CAPSULE | ORAL | Status: AC
  Administered 2015-09-13: 09:00:00
  Filled 2015-09-13: qty 1

## 2015-09-13 MED ORDER — BENZONATATE 100 MG PO CAPS
100.0000 mg | ORAL_CAPSULE | Freq: Once | ORAL | Status: AC
  Administered 2015-09-13: 100 mg via ORAL
  Filled 2015-09-13: qty 1

## 2015-09-13 MED ORDER — BENZONATATE 100 MG PO CAPS: 100.0000 mg | ORAL_CAPSULE | Freq: Three times a day (TID) | ORAL | Status: DC

## 2015-09-13 MED ORDER — ALBUTEROL SULFATE HFA 108 (90 BASE) MCG/ACT IN AERS
2.0000 | INHALATION_SPRAY | Freq: Once | RESPIRATORY_TRACT | Status: AC
  Administered 2015-09-13: 2 via RESPIRATORY_TRACT
  Filled 2015-09-13: qty 6.7

## 2015-09-13 MED ORDER — DEXAMETHASONE 4 MG PO TABS
10.0000 mg | ORAL_TABLET | Freq: Once | ORAL | Status: AC
  Administered 2015-09-13: 10 mg via ORAL
  Filled 2015-09-13: qty 2
  Filled 2015-09-13: qty 1

## 2015-09-13 MED ORDER — IBUPROFEN 800 MG PO TABS: 800.0000 mg | ORAL_TABLET | Freq: Once | ORAL | Status: DC

## 2015-09-13 MED ORDER — ALBUTEROL SULFATE HFA 108 (90 BASE) MCG/ACT IN AERS: 2.0000 | INHALATION_SPRAY | RESPIRATORY_TRACT | Status: DC | PRN

## 2015-09-13 MED ORDER — NAPROXEN 500 MG PO TABS: 500.0000 mg | ORAL_TABLET | Freq: Two times a day (BID) | ORAL | Status: DC

## 2015-09-13 NOTE — ED Provider Notes (Addendum)
CSN: [id number]     Arrival date & time 09/13/15  0751 History   First MD Initiated Contact with Patient 09/13/15 0759     Chief Complaint  Patient presents with  . URI     (Consider location/radiation/quality/duration/timing/severity/associated sxs/prior Treatment) HPI Comments: Productive yellow sputum Quit smoking 5 days ago Mom has hx of asthma No hx of asthma/wheezing  Patient is a 39 y.o. female presenting with URI.  URI Presenting symptoms: congestion, cough, ear pain (left) and sore throat   Presenting symptoms: no fever   Severity:  Moderate Onset quality:  Gradual Duration:  2 weeks Timing:  Constant Progression:  Worsening Chronicity:  New Relieved by:  Nothing Worsened by:  Certain positions Ineffective treatments: tylenol cold and flu, delsum. Associated symptoms: wheezing   Associated symptoms: no headaches and no neck pain     Past Medical History  Diagnosis Date  . SVT (supraventricular tachycardia)     resolved since discontinuing opiate abuse  . Opiate addiction    No past surgical history on file. No family history on file. Social History  Substance Use Topics  . Smoking status: Current Every Day Smoker -- 1.00 packs/day    Types: Cigarettes  . Smokeless tobacco: None  . Alcohol Use: No   OB History    No data available     Review of Systems  Constitutional: Negative for fever, chills and appetite change.  HENT: Positive for congestion, ear pain (left) and sore throat.   Eyes: Negative for visual disturbance.  Respiratory: Positive for cough, shortness of breath and wheezing.   Cardiovascular: Positive for chest pain.  Gastrointestinal: Positive for vomiting (sometimes post-tussive). Negative for nausea, abdominal pain and diarrhea.  Genitourinary: Negative for difficulty urinating.  Musculoskeletal: Negative for back pain and neck pain.  Skin: Negative for rash.  Neurological: Negative for syncope and headaches.      Allergies   Review of patient's allergies indicates no known allergies.  Home Medications   Prior to Admission medications   Medication Sig Start Date End Date Taking? Authorizing Provider  albuterol (PROVENTIL HFA;VENTOLIN HFA) 108 (90 BASE) MCG/ACT inhaler Inhale 2 puffs into the lungs every 4 (four) hours as needed for wheezing or shortness of breath. 09/13/15   Alvira Monday, MD  benzonatate (TESSALON) 100 MG capsule Take 1 capsule (100 mg total) by mouth every 8 (eight) hours. 09/13/15   Alvira Monday, MD  ibuprofen (ADVIL,MOTRIN) 800 MG tablet Take 1 tablet (800 mg total) by mouth every 8 (eight) hours as needed. 08/15/15   Charlestine Night, PA-C  metoprolol succinate (TOPROL-XL) 25 MG 24 hr tablet Take 50 mg by mouth daily.    Historical Provider, MD  naproxen (NAPROSYN) 500 MG tablet Take 1 tablet (500 mg total) by mouth 2 (two) times daily. 09/13/15   Alvira Monday, MD  naproxen sodium (ANAPROX) 220 MG tablet Take 220 mg by mouth 2 (two) times daily with a meal.    Historical Provider, MD  omeprazole (PRILOSEC) 20 MG capsule Take 1 capsule (20 mg total) by mouth daily. 08/11/14   Ladona Mow, PA-C  predniSONE (DELTASONE) 50 MG tablet Take 1 tablet (50 mg total) by mouth daily with breakfast. 08/15/15   Charlestine Night, PA-C   BP 114/78 mmHg  Pulse 72  Temp(Src) 98.1 F (36.7 C) (Oral)  Resp 18  SpO2 100%  LMP 08/29/2015 (Approximate) Physical Exam  Constitutional: She is oriented to person, place, and time. She appears well-developed and well-nourished. No distress.  Frequent coughing  HENT:  Head: Normocephalic and atraumatic.  Eyes: Conjunctivae and EOM are normal.  Neck: Normal range of motion.  Cardiovascular: Normal rate, regular rhythm, normal heart sounds and intact distal pulses.  Exam reveals no gallop and no friction rub.   No murmur heard. Pulmonary/Chest: Effort normal. No respiratory distress. She has wheezes (occasional end expiratory wheeze with forced expiration). She  has no rales.  Abdominal: Soft. She exhibits no distension. There is no tenderness. There is no guarding.  Musculoskeletal: She exhibits no edema or tenderness.  Neurological: She is alert and oriented to person, place, and time.  Skin: Skin is warm and dry. No rash noted. She is not diaphoretic. No erythema.  Nursing note and vitals reviewed.   ED Course  Procedures (including critical care time) Labs Review Labs Reviewed  POC URINE PREG, ED    Imaging Review Dg Chest 2 View  09/13/2015   CLINICAL DATA:  Cough, shortness of breath, chest pain  EXAM: CHEST  2 VIEW  COMPARISON:  10/18/2009  FINDINGS: Lungs are clear.  No pleural effusion or pneumothorax.  The heart is normal in size.  Visualized osseous structures are within normal limits.  IMPRESSION: Normal chest radiographs.   Electronically Signed   By: Charline Bills M.D.   On: 09/13/2015 08:43   I have personally reviewed and evaluated these images and lab results as part of my medical decision-making.   EKG Interpretation   Date/Time:  Saturday September 13 2015 08:21:57 EDT Ventricular Rate:  70 PR Interval:  140 QRS Duration: 88 QT Interval:  368 QTC Calculation: 397 R Axis:   73 Text Interpretation:  Sinus rhythm Nonspecific TW abnormality Since last  tracing, patient is no longer in SVT Confirmed by [facility] MD, ERIN  ([id number]) on 09/13/2015 9:10:18 AM      MDM   Final diagnoses:  URI (upper respiratory infection)  Bronchitis   39 year old female with remote history of opiate abuse, history of SVT, quit smoking 5 days ago, presents with concern for cough, wheezing, shortness of breath and sore throat.  Chest x-ray shows no signs of pneumonia.  Patient with occasional end expiratory wheeze with forced expiration. No signs of peritonsillar abscess, epiglottitis or RPA. Pertinent to test negative. Patient reports chest pain, an EKG was done which showed nonspecific T-wave changes in lead 3 however no other  significant changes from prior EKGs.  History is not consistent with ACS, patient does not have pulmonary embolus risk factors and is PERC negative. Chest pain likely musculoskeletal secondary to coughing.  Patient likely with bronchitis possible reactive airway disease.  She was given 10 mg of Decadron, albuterol with improvement of her symptoms.  Sent home with albuterol inhaler, Tessalon Perles, and naproxen for pain.    Alvira Monday, MD 09/13/15 6045  Alvira Monday, MD 09/13/15 [phone]

## 2015-09-13 NOTE — ED Notes (Signed)
MD at bedside. 

## 2015-09-13 NOTE — ED Notes (Signed)
She c/o congested, productive cough x 2 weeks.  She is in no distress.

## 2015-09-13 NOTE — ED Notes (Signed)
Pt given discharge instructions.

## 2015-09-13 NOTE — ED Notes (Addendum)
EKG given to Dr. Schlossman 

## 2015-12-04 ENCOUNTER — Emergency Department (HOSPITAL_COMMUNITY)
Admission: EM | Admit: 2015-12-04 | Discharge: 2015-12-04 | Disposition: A | Discharge status: Home / Self Care | Payer: Self-pay | Attending: Emergency Medicine | Admitting: Emergency Medicine

## 2015-12-04 ENCOUNTER — Encounter (HOSPITAL_COMMUNITY): Payer: Self-pay | Admitting: Emergency Medicine

## 2015-12-04 ENCOUNTER — Emergency Department (HOSPITAL_COMMUNITY): Payer: Medicaid Other

## 2015-12-04 DIAGNOSIS — F17210 Nicotine dependence, cigarettes, uncomplicated: Secondary | ICD-10-CM | POA: Insufficient documentation

## 2015-12-04 DIAGNOSIS — Z8679 Personal history of other diseases of the circulatory system: Secondary | ICD-10-CM | POA: Insufficient documentation

## 2015-12-04 DIAGNOSIS — F419 Anxiety disorder, unspecified: Secondary | ICD-10-CM | POA: Insufficient documentation

## 2015-12-04 DIAGNOSIS — Z7982 Long term (current) use of aspirin: Secondary | ICD-10-CM | POA: Insufficient documentation

## 2015-12-04 DIAGNOSIS — R079 Chest pain, unspecified: Secondary | ICD-10-CM | POA: Insufficient documentation

## 2015-12-04 DIAGNOSIS — R0602 Shortness of breath: Secondary | ICD-10-CM | POA: Insufficient documentation

## 2015-12-04 LAB — CBC
HCT: 41.7 % (ref 36.0–46.0)
Hemoglobin: 13.8 g/dL (ref 12.0–15.0)
MCH: 30.1 pg (ref 26.0–34.0)
MCHC: 33.1 g/dL (ref 30.0–36.0)
MCV: 91 fL (ref 78.0–100.0)
PLATELETS: 312 10*3/uL (ref 150–400)
RBC: 4.58 MIL/uL (ref 3.87–5.11)
RDW: 13.3 % (ref 11.5–15.5)
WBC: 7.1 10*3/uL (ref 4.0–10.5)

## 2015-12-04 LAB — BASIC METABOLIC PANEL
Anion gap: 8 (ref 5–15)
BUN: 16 mg/dL (ref 6–20)
CALCIUM: 9.3 mg/dL (ref 8.9–10.3)
CO2: 22 mmol/L (ref 22–32)
CREATININE: 0.81 mg/dL (ref 0.44–1.00)
Chloride: 110 mmol/L (ref 101–111)
GFR calc non Af Amer: >60 mL/min (ref >60)
Glucose, Bld: 85 mg/dL (ref 65–99)
Potassium: 3.8 mmol/L (ref 3.5–5.1)
Sodium: 140 mmol/L (ref 135–145)

## 2015-12-04 LAB — D-DIMER, QUANTITATIVE: D-Dimer, Quant: <0.27 ug/mL-FEU (ref 0.00–0.50)

## 2015-12-04 LAB — I-STAT TROPONIN, ED: TROPONIN I, POC: 0 ng/mL (ref 0.00–0.08)

## 2015-12-04 LAB — I-STAT BETA HCG BLOOD, ED (MC, WL, AP ONLY): I-stat hCG, quantitative: <5 m[IU]/mL (ref <5)

## 2015-12-04 MED ORDER — ASPIRIN 81 MG PO CHEW
324.0000 mg | CHEWABLE_TABLET | Freq: Once | ORAL | Status: AC
  Administered 2015-12-04: 324 mg via ORAL
  Filled 2015-12-04: qty 4

## 2015-12-04 MED ORDER — ALBUTEROL SULFATE (2.5 MG/3ML) 0.083% IN NEBU
5.0000 mg | INHALATION_SOLUTION | Freq: Once | RESPIRATORY_TRACT | Status: AC
  Administered 2015-12-04: 5 mg via RESPIRATORY_TRACT
  Filled 2015-12-04: qty 6

## 2015-12-04 NOTE — ED Provider Notes (Signed)
CSN: [id number]     Arrival date & time 12/04/15  [id number] History   First MD Initiated Contact with Patient 12/04/15 0831     Chief Complaint  Patient presents with  . Shortness of Breath     (Consider location/radiation/quality/duration/timing/severity/associated sxs/prior Treatment) HPI Comments: 39yo F w/ h/o SVT who p/w chest pain and SOB. PT states that yesterday she had a gradual onset of headache followed by [place] dizziness. She later developed some right-sided chest pain that is intermittent and radiates to her upper shoulder blades chest pain is worse with deep inspiration. It is been associated with some shortness of breath, worse with exertion. She has continued to have intermittent chest pain and shortness of breath today which is why she presents here. No cough/cold symptoms, fevers, vomiting, diarrhea, or recent illness. She has never had this pain before. She denies any leg swelling. No OCP use, recent travel, or history of cancer.  Family history notable for mother with CHF and history of blood clots while hospitalized.  Patient is a 39 y.o. female presenting with shortness of breath. The history is provided by the patient.  Shortness of Breath   Past Medical History  Diagnosis Date  . SVT (supraventricular tachycardia) (HCC)     resolved since discontinuing opiate abuse  . Opiate addiction (HCC)    History reviewed. No pertinent past surgical history. History reviewed. No pertinent family history. Social History  Substance Use Topics  . Smoking status: Current Every Day Smoker -- 1.00 packs/day    Types: Cigarettes  . Smokeless tobacco: None  . Alcohol Use: No   OB History    No data available     Review of Systems  Respiratory: Positive for shortness of breath.    10 Systems reviewed and are negative for acute change except as noted in the HPI.    Allergies  Review of patient's allergies indicates no known allergies.  Home Medications   Prior to  Admission medications   Medication Sig Start Date End Date Taking? Authorizing Provider  aspirin 325 MG tablet Take 325 mg by mouth every 6 (six) hours as needed for mild pain.   Yes Historical Provider, MD  albuterol (PROVENTIL HFA;VENTOLIN HFA) 108 (90 BASE) MCG/ACT inhaler Inhale 2 puffs into the lungs every 4 (four) hours as needed for wheezing or shortness of breath. Patient not taking: Reported on 12/04/2015 09/13/15   Alvira MondayErin Schlossman, MD  benzonatate (TESSALON) 100 MG capsule Take 1 capsule (100 mg total) by mouth every 8 (eight) hours. Patient not taking: Reported on 12/04/2015 09/13/15   Alvira MondayErin Schlossman, MD  ibuprofen (ADVIL,MOTRIN) 800 MG tablet Take 1 tablet (800 mg total) by mouth every 8 (eight) hours as needed. Patient not taking: Reported on 12/04/2015 08/15/15   Charlestine Nighthristopher Lawyer, PA-C  naproxen (NAPROSYN) 500 MG tablet Take 1 tablet (500 mg total) by mouth 2 (two) times daily. Patient not taking: Reported on 12/04/2015 09/13/15   Alvira MondayErin Schlossman, MD  omeprazole (PRILOSEC) 20 MG capsule Take 1 capsule (20 mg total) by mouth daily. Patient not taking: Reported on 12/04/2015 08/11/14   Ladona MowJoe Mintz, PA-C  predniSONE (DELTASONE) 50 MG tablet Take 1 tablet (50 mg total) by mouth daily with breakfast. Patient not taking: Reported on 12/04/2015 08/15/15   Charlestine Nighthristopher Lawyer, PA-C   BP 126/79 mmHg  Pulse 68  Temp(Src) 97.8 F (36.6 C) (Oral)  Resp 18  Ht 5\' 2"  (1.575 m)  Wt 175 lb (79.379 kg)  BMI 32.00 kg/m2  SpO2 97%  LMP 11/20/2015 (Approximate) Physical Exam  Constitutional: She is oriented to person, place, and time. She appears well-developed and well-nourished. No distress.  anxious  HENT:  Head: Normocephalic and atraumatic.  Moist mucous membranes  Eyes: Conjunctivae are normal. Pupils are equal, round, and reactive to light.  Neck: Neck supple.  Cardiovascular: Normal rate, regular rhythm and normal heart sounds.   No murmur heard. Pulmonary/Chest: Effort normal and  breath sounds normal.  Abdominal: Soft. Bowel sounds are normal. She exhibits no distension. There is no tenderness.  Musculoskeletal: She exhibits no edema.  Neurological: She is alert and oriented to person, place, and time.  Fluent speech  Skin: Skin is warm and dry.  Psychiatric: She has a normal mood and affect. Judgment normal.  Anxious, mildly tearful but calm  Nursing note and vitals reviewed.   ED Course  Procedures (including critical care time) Labs Review Labs Reviewed  BASIC METABOLIC PANEL  CBC  D-DIMER, QUANTITATIVE (NOT AT [facility])  I-STAT TROPOININ, ED  I-STAT BETA HCG BLOOD, ED ([facility], [facility], [facility] ONLY)    Imaging Review Dg Chest 2 View  12/04/2015  CLINICAL DATA:  Chest discomfort and pressure for the past 2 days. Shortness of breath. EXAM: CHEST  2 VIEW COMPARISON:  09/13/2015; 10/18/2009 FINDINGS: Grossly unchanged cardiac silhouette and mediastinal contours. Note is again made of a mild pectus deformity. The lungs remain hyperexpanded with flattening the bilateral diaphragms. No focal airspace opacities. No pleural effusion or pneumothorax. No evidence of edema. No acute osseous abnormalities. IMPRESSION: Mild lung hyper expansion without acute cardiopulmonary disease. Electronically Signed   By: Simonne Come M.D.   On: 12/04/2015 09:38   I have personally reviewed and evaluated these lab results as part of my medical decision-making.   EKG Interpretation   Date/Time:  Thursday December 04 2015 08:35:06 EST Ventricular Rate:  89 PR Interval:  132 QRS Duration: 86 QT Interval:  344 QTC Calculation: 418 R Axis:   78 Text Interpretation:  Sinus rhythm Borderline T abnormalities, inferior  leads No significant change since last tracing Confirmed by LITTLE MD,  RACHEL [phone]) on 12/04/2015 9:06:26 AM      MDM   Final diagnoses:  Chest pain, unspecified chest pain type  Shortness of breath   Patient presents with chest pain worse with deep inspiration and  associated with shortness of breath, worse with exertion that began yesterday. Exam, patient was anxious but otherwise well-appearing with normal vital signs. EKG was unchanged from previous. She received a DuoNeb on arrival and during my examination had clear breath sounds with normal work of breathing. Obtained chest x-ray which was unremarkable. Obtained above labs including troponin and d-dimer. Gave patient aspirin.  Labs including d-dimer and troponin negative. I was awaiting 3-hr troponin but pt called me to room requesting to leave. I stated that there is a small risk of missing ischemia as a serial troponin is more sensitive than single value. Pt voiced understanding of risk but felt she could follow up with her cardiologist as an outpatient. I encouraged her to do so and reviewed return precautions. Pt discharged in satisfactory condition.  Laurence Spates, MD 12/04/15 [phone]

## 2015-12-04 NOTE — ED Notes (Addendum)
Pt reports right sided chest pain with radiation to upper shoulder blades and SOB worsening today starting yesterday.  CP worse with deep breathing.  Pt reports hx of tachycardia but not breathing or heart issues.  Denies cough, pain not reproducible or worse with movement or palpation.  NAD, A&O.

## 2016-09-07 LAB — LAB REPORT - SCANNED: Pap: NEGATIVE

## 2017-07-21 ENCOUNTER — Encounter (HOSPITAL_COMMUNITY): Payer: Self-pay | Admitting: Family Medicine

## 2017-07-21 ENCOUNTER — Ambulatory Visit (HOSPITAL_COMMUNITY)
Admission: EM | Admit: 2017-07-21 | Discharge: 2017-07-21 | Disposition: A | Discharge status: Home / Self Care | Payer: Medicaid Other | Attending: Family Medicine | Admitting: Family Medicine

## 2017-07-21 DIAGNOSIS — L720 Epidermal cyst: Secondary | ICD-10-CM

## 2017-07-21 NOTE — ED Triage Notes (Signed)
Pt here for 2 bumps to vaginal area. sts on the outside of her labia. sts no itching, irritation, burning.

## 2017-07-21 NOTE — ED Provider Notes (Signed)
  [facility]   [id number] 07/21/17 Arrival Time: 1227  ASSESSMENT & PLAN:  1. Milia   -traumatic secondary to shaving  Reassured. Observe area. May f/u as needed.  Reviewed expectations re: course of current medical issues. Questions answered. Outlined signs and symptoms indicating need for more acute intervention. Patient verbalized understanding. After Visit Summary given.   SUBJECTIVE:  Yolanda Holmes is a 41 y.o. female who presents with complaint of "two small bumps" around her vaginal area. Noticed 3 weeks ago after shaving area. Non-painful. Non-pruritic. No change in appearance or size. Is sexually active with female partners. No h/o STD. Otherwise well. No self treatment. No h/o similar.  ROS: As per HPI.   OBJECTIVE:  Vitals:   07/21/17 1248  BP: 138/88  Pulse: 79  Resp: 18  Temp: 98.5 F (36.9 C)  SpO2: 100%     General appearance: alert; no distress Skin: (chaparone present) just below R labia are two firm, white papules, 1 to 2 mm in diameter; no sign of infection; non-tender; no bleeding; no ulcerations  No Known Allergies  PMHx, SurgHx, SocialHx, Medications, and Allergies were reviewed in the Visit Navigator and updated as appropriate.      Mardella LaymanHagler, Demetric Dunnaway, MD 07/21/17 1316

## 2018-03-30 ENCOUNTER — Other Ambulatory Visit: Payer: Self-pay

## 2018-03-30 ENCOUNTER — Emergency Department (HOSPITAL_COMMUNITY): Admission: EM | Admit: 2018-03-30 | Discharge: 2018-03-30 | Discharge status: Against Medical Advice | Payer: Self-pay

## 2018-03-30 NOTE — ED Notes (Signed)
Called pt X3 and did not see

## 2018-09-07 ENCOUNTER — Emergency Department (HOSPITAL_COMMUNITY)
Admission: EM | Admit: 2018-09-07 | Discharge: 2018-09-07 | Disposition: A | Discharge status: Home / Self Care | Payer: Self-pay | Attending: Emergency Medicine | Admitting: Emergency Medicine

## 2018-09-07 ENCOUNTER — Encounter (HOSPITAL_COMMUNITY): Payer: Self-pay

## 2018-09-07 ENCOUNTER — Other Ambulatory Visit: Payer: Self-pay

## 2018-09-07 DIAGNOSIS — Z77098 Contact with and (suspected) exposure to other hazardous, chiefly nonmedicinal, chemicals: Secondary | ICD-10-CM | POA: Insufficient documentation

## 2018-09-07 DIAGNOSIS — H5712 Ocular pain, left eye: Secondary | ICD-10-CM | POA: Insufficient documentation

## 2018-09-07 DIAGNOSIS — F17210 Nicotine dependence, cigarettes, uncomplicated: Secondary | ICD-10-CM | POA: Insufficient documentation

## 2018-09-07 MED ORDER — FLUORESCEIN SODIUM 1 MG OP STRP
ORAL_STRIP | OPHTHALMIC | Status: AC
  Administered 2018-09-07: 1 via OPHTHALMIC
  Filled 2018-09-07: qty 1

## 2018-09-07 MED ORDER — FLUORESCEIN SODIUM 1 MG OP STRP
1.0000 | ORAL_STRIP | Freq: Once | OPHTHALMIC | Status: AC
  Administered 2018-09-07: 1 via OPHTHALMIC

## 2018-09-07 MED ORDER — TETRACAINE HCL 0.5 % OP SOLN
2.0000 [drp] | Freq: Once | OPHTHALMIC | Status: AC
  Administered 2018-09-07: 2 [drp] via OPHTHALMIC

## 2018-09-07 MED ORDER — CYCLOPENTOLATE HCL 1 % OP SOLN
2.0000 [drp] | Freq: Once | OPHTHALMIC | Status: AC
  Administered 2018-09-07: 2 [drp] via OPHTHALMIC
  Filled 2018-09-07: qty 2

## 2018-09-07 MED ORDER — CIPROFLOXACIN HCL 0.3 % OP OINT: TOPICAL_OINTMENT | OPHTHALMIC | 0 refills | Status: DC

## 2018-09-07 NOTE — ED Notes (Signed)
ED Provider at bedside. 

## 2018-09-07 NOTE — ED Provider Notes (Signed)
[facility]-EMERGENCY DEPT Provider Note   CSN: [id number] Arrival date & time: 09/07/18  [id number]     History   Chief Complaint Chief Complaint  Patient presents with  . Chemical Exposure  . Eye Problem    HPI Yolanda Holmes is a 42 y.o. female.  Pt presents to the ED today with a left eye exposure of chlorine granule powder for pools.  Pt said she opened a bag of chlorine granules to put into a pool at her place of employment when the bag burst and chlorine granules went into her eye.  She did immediately flush her eyes.  Pt's eyes were flushed again here prior to my seeing her.  Pt said her right eye feels ok.  It is just her left eye that hurts.     Past Medical History:  Diagnosis Date  . Opiate addiction (HCC)   . SVT (supraventricular tachycardia) (HCC)    resolved since discontinuing opiate abuse    There are no active problems to display for this patient.   History reviewed. No pertinent surgical history.   OB History   None      Home Medications    Prior to Admission medications   Medication Sig Start Date End Date Taking? Authorizing Provider  aspirin 325 MG tablet Take 325 mg by mouth every 6 (six) hours as needed for mild pain.   Yes [provider]  albuterol (PROVENTIL HFA;VENTOLIN HFA) 108 (90 BASE) MCG/ACT inhaler Inhale 2 puffs into the lungs every 4 (four) hours as needed for wheezing or shortness of breath. Patient not taking: Reported on 12/04/2015 09/13/15   Alvira MondaySchlossman, Erin, MD  benzonatate (TESSALON) 100 MG capsule Take 1 capsule (100 mg total) by mouth every 8 (eight) hours. Patient not taking: Reported on 12/04/2015 09/13/15   Alvira MondaySchlossman, Erin, MD  ciprofloxacin (CILOXAN) 0.3 % ophthalmic ointment Apply 1/2 inch to left eye q3 hours while awake. 09/07/18   Jacalyn LefevreHaviland, Johnchristopher Sarvis, MD  ibuprofen (ADVIL,MOTRIN) 800 MG tablet Take 1 tablet (800 mg total) by mouth every 8 (eight) hours as needed. Patient not taking: Reported on  12/04/2015 08/15/15   Charlestine NightLawyer, Christopher, PA-C  naproxen (NAPROSYN) 500 MG tablet Take 1 tablet (500 mg total) by mouth 2 (two) times daily. Patient not taking: Reported on 12/04/2015 09/13/15   Alvira MondaySchlossman, Erin, MD  omeprazole (PRILOSEC) 20 MG capsule Take 1 capsule (20 mg total) by mouth daily. Patient not taking: Reported on 12/04/2015 08/11/14   Ladona MowMintz, Joe, PA-C  predniSONE (DELTASONE) 50 MG tablet Take 1 tablet (50 mg total) by mouth daily with breakfast. Patient not taking: Reported on 12/04/2015 08/15/15   Charlestine NightLawyer, Christopher, PA-C    Family History History reviewed. No pertinent family history.  Social History Social History   Tobacco Use  . Smoking status: Current Every Day Smoker    Packs/day: 1.00    Types: Cigarettes  Substance Use Topics  . Alcohol use: No  . Drug use: No    Comment: former opiate abuser     Allergies   Patient has no known allergies.   Review of Systems Review of Systems  HENT:       Left eye pain  All other systems reviewed and are negative.    Physical Exam Updated Vital Signs BP (!) 146/91 (BP Location: Left Arm)   Pulse 75   Temp 98 F (36.7 C) (Oral)   Resp 18   Wt 81.6 kg   SpO2 100%   BMI 32.92 kg/m  Physical Exam  Constitutional: She is oriented to person, place, and time. She appears well-developed and well-nourished.  HENT:  Head: Normocephalic and atraumatic.  Right Ear: External ear normal.  Left Ear: External ear normal.  Nose: Nose normal.  Mouth/Throat: Oropharynx is clear and moist.  Eyes: Pupils are equal, round, and reactive to light. EOM are normal. Left conjunctiva is injected.  Slit lamp exam:      The left eye shows corneal abrasion.  Left eye ph 7.0  Neck: Normal range of motion. Neck supple.  Cardiovascular: Normal rate, regular rhythm, normal heart sounds and intact distal pulses.  Pulmonary/Chest: Effort normal and breath sounds normal.  Abdominal: Soft. Bowel sounds are normal.  Musculoskeletal:  Normal range of motion.  Neurological: She is alert and oriented to person, place, and time.  Skin: Skin is warm. Capillary refill takes less than 2 seconds.  Psychiatric: She has a normal mood and affect. Her behavior is normal. Judgment and thought content normal.  Nursing note and vitals reviewed.    ED Treatments / Results  Labs (all labs ordered are listed, but only abnormal results are displayed) Labs Reviewed - No data to display  EKG None  Radiology No results found.  Procedures Procedures (including critical care time)  Medications Ordered in ED Medications  tetracaine (PONTOCAINE) 0.5 % ophthalmic solution 2 drop (2 drops Right Eye Given 09/07/18 1024)  fluorescein ophthalmic strip 1 strip (1 strip Left Eye Given 09/07/18 1025)  cyclopentolate (CYCLODRYL,CYCLOGYL) 1 % ophthalmic solution 2 drop (2 drops Left Eye Given 09/07/18 1144)     Initial Impression / Assessment and Plan / ED Course  I have reviewed the triage vital signs and the nursing notes.  Pertinent labs & imaging results that were available during my care of the patient were reviewed by me and considered in my medical decision making (see chart for details).    Pt's eye flushed with an additional 1L NS.  Ph rechecked and remains 7.0.  The pt is encouraged to f/u with ophthalmology.  The pt does not want any pain medications due to a remote hx of opiate addiction.  She knows to return if worse.  Final Clinical Impressions(s) / ED Diagnoses   Final diagnoses:  Chemical exposure of eye    ED Discharge Orders         Ordered    ciprofloxacin (CILOXAN) 0.3 % ophthalmic ointment     09/07/18 1142           Jacalyn Lefevre, MD 09/07/18 1145

## 2018-09-07 NOTE — ED Triage Notes (Signed)
Pt reports that she was opening chlorine granules to put into a pool at her place of employment when the bag burst and the chlorine granules went into both eyes. Pt reports pain/irritation in both eyes but more in the left. Pt was immediatly taken to eye wash station. Eyes were flushed for 5 mins at eye wash. Pts eyes were then flushed with 60cc.

## 2018-09-07 NOTE — ED Notes (Signed)
Per MD: Please irrigate eyes with 1L NS via nasal cannula.

## 2019-10-08 ENCOUNTER — Other Ambulatory Visit: Payer: Self-pay

## 2019-10-08 ENCOUNTER — Ambulatory Visit (INDEPENDENT_AMBULATORY_CARE_PROVIDER_SITE_OTHER): Payer: Medicaid Other | Admitting: *Deleted

## 2019-10-08 DIAGNOSIS — O0990 Supervision of high risk pregnancy, unspecified, unspecified trimester: Secondary | ICD-10-CM

## 2019-10-08 NOTE — Progress Notes (Signed)
10:42 I called Tishia for her telephone visit for new ob and left a message I am calling for her telephone visit and will call again in a few minutes. Jolene Guyett,RN 10:47 I called Balinda again and left a  Message I was calling for her telephone visit and since I did not reach her; it will need to be rescheduled and she will need to call the office.  Laurieann Friddle,RN

## 2019-10-15 ENCOUNTER — Encounter: Payer: Self-pay | Admitting: Obstetrics & Gynecology

## 2019-10-17 ENCOUNTER — Encounter: Payer: Self-pay | Admitting: *Deleted

## 2019-10-22 LAB — OB RESULTS CONSOLE RPR: RPR: NONREACTIVE

## 2019-10-22 LAB — OB RESULTS CONSOLE HEPATITIS B SURFACE ANTIGEN: Hepatitis B Surface Ag: NEGATIVE

## 2019-10-22 LAB — OB RESULTS CONSOLE ANTIBODY SCREEN: Antibody Screen: NEGATIVE

## 2019-10-22 LAB — OB RESULTS CONSOLE ABO/RH: RH Type: POSITIVE

## 2019-10-22 LAB — OB RESULTS CONSOLE GC/CHLAMYDIA
Chlamydia: NEGATIVE
Gonorrhea: NEGATIVE

## 2019-10-22 LAB — OB RESULTS CONSOLE HIV ANTIBODY (ROUTINE TESTING): HIV: NONREACTIVE

## 2019-10-22 LAB — OB RESULTS CONSOLE RUBELLA ANTIBODY, IGM: Rubella: IMMUNE

## 2019-10-24 ENCOUNTER — Ambulatory Visit (INDEPENDENT_AMBULATORY_CARE_PROVIDER_SITE_OTHER): Payer: Medicaid Other | Admitting: *Deleted

## 2019-10-24 ENCOUNTER — Other Ambulatory Visit: Payer: Self-pay

## 2019-10-24 DIAGNOSIS — O0990 Supervision of high risk pregnancy, unspecified, unspecified trimester: Secondary | ICD-10-CM | POA: Insufficient documentation

## 2019-10-24 NOTE — Progress Notes (Signed)
I called Shalona for her telephone visit for her new ob intake and she reports she found another doctor and would like to cancel her appointments with [place]. I wished her well with her pregnancy and informed her we will cancel her appointments.  Sapir Lavey,RN

## 2019-12-21 NOTE — L&D Delivery Note (Signed)
Delivery Note At  a viable female was delivered via  (Presentation:OA      ).  APGAR:8 ,9 ; weight pending  .   Placenta status:complete  ,  . 2V Cord:   with the following complications:none    Anesthesia:  None Episiotomy:  None Lacerations:  None  Est. Blood Loss (mL):   200cc Mom to postpartum.  Baby to Couplet care / Skin to Skin.  Henderson Newcomer Prothero 04/04/2020, 3:27 PM

## 2020-03-26 ENCOUNTER — Encounter: Payer: Medicaid Other | Attending: Obstetrics and Gynecology | Admitting: Registered"

## 2020-03-27 ENCOUNTER — Telehealth (HOSPITAL_COMMUNITY): Payer: Self-pay | Admitting: *Deleted

## 2020-03-27 ENCOUNTER — Encounter (HOSPITAL_COMMUNITY): Payer: Self-pay | Admitting: *Deleted

## 2020-03-27 NOTE — Telephone Encounter (Signed)
Preadmission screen  

## 2020-04-03 ENCOUNTER — Inpatient Hospital Stay (HOSPITAL_COMMUNITY)
Admission: AD | Admit: 2020-04-03 | Discharge: 2020-04-05 | DRG: 807 | Disposition: A | Discharge status: Home / Self Care | Payer: Medicaid Other | Attending: Obstetrics & Gynecology | Admitting: Obstetrics & Gynecology

## 2020-04-03 ENCOUNTER — Other Ambulatory Visit: Payer: Self-pay

## 2020-04-03 ENCOUNTER — Encounter (HOSPITAL_COMMUNITY): Payer: Self-pay | Admitting: Obstetrics and Gynecology

## 2020-04-03 DIAGNOSIS — Z880 Allergy status to penicillin: Secondary | ICD-10-CM

## 2020-04-03 DIAGNOSIS — O134 Gestational [pregnancy-induced] hypertension without significant proteinuria, complicating childbirth: Principal | ICD-10-CM | POA: Diagnosis present

## 2020-04-03 DIAGNOSIS — F17210 Nicotine dependence, cigarettes, uncomplicated: Secondary | ICD-10-CM | POA: Diagnosis present

## 2020-04-03 DIAGNOSIS — Z3A38 38 weeks gestation of pregnancy: Secondary | ICD-10-CM

## 2020-04-03 DIAGNOSIS — O4703 False labor before 37 completed weeks of gestation, third trimester: Secondary | ICD-10-CM | POA: Diagnosis not present

## 2020-04-03 DIAGNOSIS — O99334 Smoking (tobacco) complicating childbirth: Secondary | ICD-10-CM | POA: Diagnosis present

## 2020-04-03 DIAGNOSIS — O479 False labor, unspecified: Secondary | ICD-10-CM

## 2020-04-03 DIAGNOSIS — Z20822 Contact with and (suspected) exposure to covid-19: Secondary | ICD-10-CM | POA: Diagnosis present

## 2020-04-03 DIAGNOSIS — O24410 Gestational diabetes mellitus in pregnancy, diet controlled: Secondary | ICD-10-CM | POA: Diagnosis not present

## 2020-04-03 DIAGNOSIS — O133 Gestational [pregnancy-induced] hypertension without significant proteinuria, third trimester: Secondary | ICD-10-CM

## 2020-04-03 DIAGNOSIS — O326XX0 Maternal care for compound presentation, not applicable or unspecified: Secondary | ICD-10-CM | POA: Diagnosis present

## 2020-04-03 DIAGNOSIS — O24419 Gestational diabetes mellitus in pregnancy, unspecified control: Secondary | ICD-10-CM | POA: Diagnosis present

## 2020-04-03 DIAGNOSIS — O24420 Gestational diabetes mellitus in childbirth, diet controlled: Secondary | ICD-10-CM | POA: Diagnosis present

## 2020-04-03 DIAGNOSIS — O139 Gestational [pregnancy-induced] hypertension without significant proteinuria, unspecified trimester: Secondary | ICD-10-CM | POA: Diagnosis present

## 2020-04-03 LAB — RAPID URINE DRUG SCREEN, HOSP PERFORMED
Amphetamines: NOT DETECTED
Barbiturates: NOT DETECTED
Benzodiazepines: NOT DETECTED
Cocaine: NOT DETECTED
Opiates: NOT DETECTED
Tetrahydrocannabinol: NOT DETECTED

## 2020-04-03 LAB — COMPREHENSIVE METABOLIC PANEL
ALT: 17 U/L (ref 0–44)
AST: 17 U/L (ref 15–41)
Albumin: 2.7 g/dL — ABNORMAL LOW (ref 3.5–5.0)
Alkaline Phosphatase: 140 U/L — ABNORMAL HIGH (ref 38–126)
Anion gap: 11 (ref 5–15)
BUN: 8 mg/dL (ref 6–20)
CO2: 20 mmol/L — ABNORMAL LOW (ref 22–32)
Calcium: 8.9 mg/dL (ref 8.9–10.3)
Chloride: 105 mmol/L (ref 98–111)
Creatinine, Ser: 0.61 mg/dL (ref 0.44–1.00)
GFR calc Af Amer: >60 mL/min (ref >60)
GFR calc non Af Amer: >60 mL/min (ref >60)
Glucose, Bld: 89 mg/dL (ref 70–99)
Potassium: 3.7 mmol/L (ref 3.5–5.1)
Sodium: 136 mmol/L (ref 135–145)
Total Bilirubin: 0.2 mg/dL — ABNORMAL LOW (ref 0.3–1.2)
Total Protein: 5.9 g/dL — ABNORMAL LOW (ref 6.5–8.1)

## 2020-04-03 LAB — PROTEIN / CREATININE RATIO, URINE
Creatinine, Urine: 89.85 mg/dL
Protein Creatinine Ratio: 0.16 mg/mg{Cre} — ABNORMAL HIGH (ref 0.00–0.15)
Total Protein, Urine: 14 mg/dL

## 2020-04-03 LAB — CBC
HCT: 37.1 % (ref 36.0–46.0)
HCT: 37.3 % (ref 36.0–46.0)
Hemoglobin: 12.2 g/dL (ref 12.0–15.0)
Hemoglobin: 12.4 g/dL (ref 12.0–15.0)
MCH: 30.3 pg (ref 26.0–34.0)
MCH: 30.7 pg (ref 26.0–34.0)
MCHC: 32.9 g/dL (ref 30.0–36.0)
MCHC: 33.2 g/dL (ref 30.0–36.0)
MCV: 92.3 fL (ref 80.0–100.0)
MCV: 92.3 fL (ref 80.0–100.0)
Platelets: 392 10*3/uL (ref 150–400)
Platelets: 390 10*3/uL (ref 150–400)
RBC: 4.02 MIL/uL (ref 3.87–5.11)
RBC: 4.04 MIL/uL (ref 3.87–5.11)
RDW: 12.8 % (ref 11.5–15.5)
RDW: 12.8 % (ref 11.5–15.5)
WBC: 12.8 10*3/uL — ABNORMAL HIGH (ref 4.0–10.5)
WBC: 11.9 10*3/uL — ABNORMAL HIGH (ref 4.0–10.5)
nRBC: 0 % (ref 0.0–0.2)
nRBC: 0 % (ref 0.0–0.2)

## 2020-04-03 LAB — GLUCOSE, CAPILLARY
Glucose-Capillary: 86 mg/dL (ref 70–99)
Glucose-Capillary: 153 mg/dL — ABNORMAL HIGH (ref 70–99)
Glucose-Capillary: 87 mg/dL (ref 70–99)

## 2020-04-03 LAB — TYPE AND SCREEN
ABO/RH(D): A POS
Antibody Screen: NEGATIVE

## 2020-04-03 LAB — ABO/RH: ABO/RH(D): A POS

## 2020-04-03 LAB — SARS CORONAVIRUS 2 (TAT 6-24 HRS): SARS Coronavirus 2: NEGATIVE

## 2020-04-03 LAB — RPR: RPR Ser Ql: NONREACTIVE

## 2020-04-03 MED ORDER — OXYTOCIN 40 UNITS IN NORMAL SALINE INFUSION - SIMPLE MED
1.0000 m[IU]/min | INTRAVENOUS | Status: DC
  Administered 2020-04-04: 2 m[IU]/min via INTRAVENOUS

## 2020-04-03 MED ORDER — SOD CITRATE-CITRIC ACID 500-334 MG/5ML PO SOLN: 30.0000 mL | ORAL | Status: DC | PRN

## 2020-04-03 MED ORDER — OXYTOCIN 40 UNITS IN NORMAL SALINE INFUSION - SIMPLE MED
1.0000 m[IU]/min | INTRAVENOUS | Status: DC
  Administered 2020-04-03: 2 m[IU]/min via INTRAVENOUS
  Filled 2020-04-03: qty 1000

## 2020-04-03 MED ORDER — LIDOCAINE HCL (PF) 1 % IJ SOLN: 30.0000 mL | INTRAMUSCULAR | Status: DC | PRN

## 2020-04-03 MED ORDER — ONDANSETRON HCL 4 MG/2ML IJ SOLN
4.0000 mg | Freq: Four times a day (QID) | INTRAMUSCULAR | Status: DC | PRN
  Filled 2020-04-03: qty 2

## 2020-04-03 MED ORDER — OXYCODONE-ACETAMINOPHEN 5-325 MG PO TABS: 2.0000 | ORAL_TABLET | ORAL | Status: DC | PRN

## 2020-04-03 MED ORDER — TERBUTALINE SULFATE 1 MG/ML IJ SOLN: 0.2500 mg | Freq: Once | INTRAMUSCULAR | Status: DC | PRN

## 2020-04-03 MED ORDER — MISOPROSTOL 50MCG HALF TABLET
50.0000 ug | ORAL_TABLET | ORAL | Status: DC
  Administered 2020-04-03: 50 ug via ORAL
  Filled 2020-04-03: qty 1

## 2020-04-03 MED ORDER — OXYTOCIN 40 UNITS IN NORMAL SALINE INFUSION - SIMPLE MED: 2.5000 [IU]/h | INTRAVENOUS | Status: DC

## 2020-04-03 MED ORDER — LACTATED RINGERS IV SOLN
INTRAVENOUS | Status: DC
  Administered 2020-04-03: 125 mL/h via INTRAVENOUS

## 2020-04-03 MED ORDER — OXYCODONE-ACETAMINOPHEN 5-325 MG PO TABS: 1.0000 | ORAL_TABLET | ORAL | Status: DC | PRN

## 2020-04-03 MED ORDER — OXYTOCIN BOLUS FROM INFUSION
500.0000 mL | Freq: Once | INTRAVENOUS | Status: AC
  Administered 2020-04-04: 500 mL via INTRAVENOUS

## 2020-04-03 MED ORDER — ACETAMINOPHEN 325 MG PO TABS
650.0000 mg | ORAL_TABLET | ORAL | Status: DC | PRN
  Administered 2020-04-03 – 2020-04-04 (×2): 650 mg via ORAL
  Filled 2020-04-03 (×2): qty 2

## 2020-04-03 MED ORDER — LACTATED RINGERS IV SOLN: 500.0000 mL | INTRAVENOUS | Status: DC | PRN

## 2020-04-03 MED ORDER — MISOPROSTOL 100 MCG PO TABS
25.0000 ug | ORAL_TABLET | ORAL | Status: DC
  Administered 2020-04-03: 25 ug via ORAL
  Filled 2020-04-03 (×2): qty 1

## 2020-04-03 NOTE — Progress Notes (Signed)
Labor Progress Note  Margarethe Virgen is a 44 y.o. female, G5P3013, IUP at 38 weeks, presenting for new onset GHTN, admitted to see yellow lights in corners of eyes, not currently, with elevated BP, not severe range. Per Dr Sallye Ober pt to be admitted for [facility] with severe feature, declined to start magnesium at this time. PreE labs unremakable. Pt endorses to H/O opioid use disorder, last use x7 years ago (During opoid/cocaine addiction, none since, negative cardiology w/u in past.). Prenatal history significant for GDMA2 none compliment with glyburide, pt denies ever taking and endorses diet controlled. EFW 3/31 5.10lbs. 2VC. GBS-. Baby female.   Subjective: Pt resting in chair in stable condition, pt denies any question and in NAD. Pt feeling slight mild cxt but not comp lining of them. Cerix still feels very tight, and thick with no change on pitocin 10, plan changed to use cervical ripening agent, pitocin stopped and Cytotec buccally to placed in 30 mins.  Patient Active Problem List   Diagnosis Date Noted  . Gestational hypertension 04/03/2020  . Supervision of high risk pregnancy, antepartum 10/24/2019   Objective: BP (!) 117/52   Pulse 91   Temp 98.8 F (37.1 C) (Oral)   Resp 17   Ht 5\' 2"  (1.575 m)   Wt 108.4 kg   LMP 07/12/2019   SpO2 100%   BMI 43.71 kg/m  No intake/output data recorded. No intake/output data recorded. NST: FHR baseline 130 bpm, Variability: moderate, Accelerations:present, Decelerations:  Absent= Cat 1/Reactive CTX:  irregular, every 2-6 minutes Uterus gravid, soft non tender, moderate to palpate with contractions.  SVE:  Dilation: 2 Effacement (%): 60 Station: 07/14/2019 Exam by:: [email or web address], CNM Pitocin at 10, but stop mUn/min  Assessment:  Amritha Yorke is a 44 y.o. female, G5P3013, IUP at 38 weeks, presenting for new onset GHTN, admitted to see yellow lights in corners of eyes, not currently, with elevated BP, not severe range. Per Dr 45 pt to be admitted  for [facility] with severe feature, declined to start magnesium at this time. PreE labs unremakable. Pt endorses to H/O opioid use disorder, last use x7 years ago (During opoid/cocaine addiction, none since, negative cardiology w/u in past.). Prenatal history significant for GDMA2 none compliment with glyburide, pt denies ever taking and endorses diet controlled. EFW 3/31 5.10lbs. 2VC. GBS-. Baby female. Not progressing, not in labor, stopped pitocin due to needing cervical ripening. Plan to start Cytotec bucacally 4/31 po in 30 mins.  Patient Active Problem List   Diagnosis Date Noted  . Gestational hypertension 04/03/2020  . Supervision of high risk pregnancy, antepartum 10/24/2019   NICHD: Category 1  Membranes:  Intact, no s/s of infection  Induction:    Cytotec x Plan to place 13/03/2019 in for cervical ripening.   Pitocin - 10 and stopped.   Pain management:               IV pain management: x Declined, pt has substance use disorder and prefers to abstains from taking  meds.              Epidural placement:  PRN but declined for now.   GBS Negative  A1GDM: stable Q4H CBG monitoring.  CBG (last 3)  Recent Labs    04/03/20 0853  GLUCAP 86   Plan: Continue labor plan Continuous/intermittent monitoring Rest Ambulate DMA2: Monitor cbg Q4 h and Q2H in active labor.  GHTN: Monitor BP, start IV labetalol if Bps >160/110, plan to start mag if severe  range BP occur or worsen in neuro s/sx.  Frequent position changes to facilitate fetal rotation and descent. Will reassess with cervical exam at 1830 or earlier if necessary Plan to start cervical ripening with 56mcg Cytotec buccally.  Once cervix ripen plan for AROM and pitocin.  Anticipate labor progression and vaginal delivery.   Noralyn Pick, NP-C, CNM, MSN 04/03/2020. 2:50 PM

## 2020-04-03 NOTE — Progress Notes (Signed)
Per [facility], CNM, start Pitocin without cervical exam within thirty minutes.

## 2020-04-03 NOTE — Progress Notes (Signed)
Labor Progress Note  Yolanda Holmes is a 44 y.o. female, G5P3013, IUP at 38 weeks, presenting for new onset GHTN, admitted to see yellow lights in corners of eyes, not currently, with elevated BP, not severe range. Per Dr Sallye Ober pt to be admitted for [facility] with severe feature, declined to start magnesium at this time. PreE labs unremakable. Pt endorses to H/O opioid use disorder, last use x7 years ago (During opoid/cocaine addiction, none since, negative cardiology w/u in past.). Prenatal history significant for GDMA2 none compliment with glyburide, pt denies ever taking and endorses diet controlled. EFW 3/31 5.10lbs. 2VC. GBS-. Baby female.   Subjective: Pt resting well, feeling cxt but tolerated well, no further complaints.  Patient Active Problem List   Diagnosis Date Noted  . Gestational hypertension 04/03/2020  . Supervision of high risk pregnancy, antepartum 10/24/2019   Objective: BP (!) 146/76   Pulse 87   Temp 98.8 F (37.1 C) (Oral)   Resp 16   Ht 5\' 2"  (1.575 m)   Wt 108.4 kg   LMP 07/12/2019   SpO2 100%   BMI 43.71 kg/m  No intake/output data recorded. No intake/output data recorded. NST: FHR baseline 125 bpm, Variability: moderate, Accelerations:present, Decelerations:  Absent= Cat 1/Reactive CTX:  irregular, every 2-4 minutes Uterus gravid, soft non tender, moderate to palpate with contractions.  SVE:  Dilation: 2 Effacement (%): 60 Station: 07/14/2019 Exam by:: [email or web address], CNM Pitocin (0) mUn/min  Assessment:  Yolanda Holmes is a 44 y.o. female, G5P3013, IUP at 38 weeks, presenting for new onset GHTN, admitted to see yellow lights in corners of eyes, not currently, with elevated BP, not severe range. Per Dr 45 pt to be admitted for [facility] with severe feature, declined to start magnesium at this time. PreE labs unremakable. Pt endorses to H/O opioid use disorder, last use x7 years ago (During opoid/cocaine addiction, none since, negative cardiology w/u in past.). Prenatal  history significant for GDMA2 none compliment with glyburide, pt denies ever taking and endorses diet controlled. EFW 3/31 5.10lbs. 2VC. GBS-. Baby female. Not progressing, not in labor, stopped pitocin due to needing cervical ripening. Ripening occurring with buccal Cytotec, pt effaced a little more form 50-60. Patient Active Problem List   Diagnosis Date Noted  . Gestational hypertension 04/03/2020  . Supervision of high risk pregnancy, antepartum 10/24/2019   NICHD: Category 1  Membranes:  Intact, no s/s of infection  Induction:    Cytotec x 13/03/2019 given buccally @ 1459, and 25 mcg given @ 2000  Pitocin - 10 and stopped.   Pain management:               IV pain management: x Declined, pt has substance use disorder and prefers to abstains from taking  meds.              Epidural placement:  PRN but declined for now.   GBS Negative  GHTN: 146/76, asymptomatic now, neg preE labs.   A1GDM: stable Q4H CBG monitoring.  CBG (last 3)  Recent Labs    04/03/20 0853  GLUCAP 86   Plan: Continue labor plan Continuous monitoring Rest Ambulate DMA2: Monitor cbg Q4 h and Q2H in active labor.  GHTN: Monitor BP, start IV labetalol if Bps >160/110, plan to start mag if severe range BP occur or worsen in neuro s/sx.  Frequent position changes to facilitate fetal rotation and descent. Will reassess with cervical exam at 1830 or earlier if necessary Plan to start cervical ripening with 04/05/20  Cytotec buccally.  Once cervix ripen plan for AROM and pitocin.  Anticipate labor progression and vaginal delivery.   Noralyn Pick, NP-C, CNM, MSN 04/03/2020. 7:04 PM

## 2020-04-03 NOTE — Progress Notes (Signed)
Spoke to [facility], patient will eat and then start Pitocin per order.

## 2020-04-03 NOTE — MAU Note (Signed)
Ctxs since 75mn. Scant bloody show. 2cm last sve. Denies LOF. GDM diet control

## 2020-04-03 NOTE — Progress Notes (Signed)
Patient made aware that a urine sample is needed.   

## 2020-04-03 NOTE — Progress Notes (Signed)
Spoke to Dr. Sallye Ober, received order.

## 2020-04-03 NOTE — H&P (Addendum)
Yolanda Holmes is a 44 y.o. female, G5P3013, IUP at 30 weeks, presenting for new onset GHTN, admitted to see yellow lights in corners of eyes, not currently, with elevated BP, not severe range. Per Dr Alesia Richards pt to be admitted for [facility] with severe feature, declined to start magnesium at this time. PreE labs unremakable. Pt endorses to H/O opioid use disorder, last use x7 years ago (During opoid/cocaine addiction, none since, negative cardiology w/u in past.). Prenatal history significant for GDMA2 none compliment with glyburide, pt denies ever taking and endorses diet controlled. EFW 3/31 5.10lbs. [place]. GBS-. Baby female. Pt endorse + Fm. Denies vaginal leakage. Denies vaginal bleeding. Denies feeling cxt's.   Patient Active Problem List   Diagnosis Date Noted  . Gestational hypertension 04/03/2020  . Supervision of high risk pregnancy, antepartum 10/24/2019    Medications Prior to Admission  Medication Sig Dispense Refill Last Dose  . Prenatal Vit-Fe Fumarate-FA (PRENATAL MULTIVITAMIN) TABS tablet Take 1 tablet by mouth daily at 12 noon.   04/02/2020 at Unknown time  . albuterol (PROVENTIL HFA;VENTOLIN HFA) 108 (90 BASE) MCG/ACT inhaler Inhale 2 puffs into the lungs every 4 (four) hours as needed for wheezing or shortness of breath. (Patient not taking: Reported on 12/04/2015) 1 Inhaler 0   . aspirin 325 MG tablet Take 325 mg by mouth every 6 (six) hours as needed for mild pain.     . benzonatate (TESSALON) 100 MG capsule Take 1 capsule (100 mg total) by mouth every 8 (eight) hours. (Patient not taking: Reported on 12/04/2015) 16 capsule 0   . ciprofloxacin (CILOXAN) 0.3 % ophthalmic ointment Apply 1/2 inch to left eye q3 hours while awake. 3.5 g 0   . ibuprofen (ADVIL,MOTRIN) 800 MG tablet Take 1 tablet (800 mg total) by mouth every 8 (eight) hours as needed. (Patient not taking: Reported on 12/04/2015) 30 tablet 0   . naproxen (NAPROSYN) 500 MG tablet Take 1 tablet (500 mg total) by mouth 2 (two) times  daily. (Patient not taking: Reported on 12/04/2015) 12 tablet 0   . omeprazole (PRILOSEC) 20 MG capsule Take 1 capsule (20 mg total) by mouth daily. (Patient not taking: Reported on 12/04/2015) 5 capsule 0   . predniSONE (DELTASONE) 50 MG tablet Take 1 tablet (50 mg total) by mouth daily with breakfast. (Patient not taking: Reported on 12/04/2015) 7 tablet 0     Past Medical History:  Diagnosis Date  . Gestational diabetes   . Opiate addiction ([place])   . Single vessel of umbilical cord   . SVT (supraventricular tachycardia) (HCC)    resolved since discontinuing opiate abuse     No current facility-administered medications on file prior to encounter.   Current Outpatient Medications on File Prior to Encounter  Medication Sig Dispense Refill  . Prenatal Vit-Fe Fumarate-FA (PRENATAL MULTIVITAMIN) TABS tablet Take 1 tablet by mouth daily at 12 noon.    Marland Kitchen albuterol (PROVENTIL HFA;VENTOLIN HFA) 108 (90 BASE) MCG/ACT inhaler Inhale 2 puffs into the lungs every 4 (four) hours as needed for wheezing or shortness of breath. (Patient not taking: Reported on 12/04/2015) 1 Inhaler 0  . aspirin 325 MG tablet Take 325 mg by mouth every 6 (six) hours as needed for mild pain.    . benzonatate (TESSALON) 100 MG capsule Take 1 capsule (100 mg total) by mouth every 8 (eight) hours. (Patient not taking: Reported on 12/04/2015) 16 capsule 0  . ciprofloxacin (CILOXAN) 0.3 % ophthalmic ointment Apply 1/2 inch to left eye q3 hours  while awake. 3.5 g 0  . ibuprofen (ADVIL,MOTRIN) 800 MG tablet Take 1 tablet (800 mg total) by mouth every 8 (eight) hours as needed. (Patient not taking: Reported on 12/04/2015) 30 tablet 0  . naproxen (NAPROSYN) 500 MG tablet Take 1 tablet (500 mg total) by mouth 2 (two) times daily. (Patient not taking: Reported on 12/04/2015) 12 tablet 0  . omeprazole (PRILOSEC) 20 MG capsule Take 1 capsule (20 mg total) by mouth daily. (Patient not taking: Reported on 12/04/2015) 5 capsule 0  .  predniSONE (DELTASONE) 50 MG tablet Take 1 tablet (50 mg total) by mouth daily with breakfast. (Patient not taking: Reported on 12/04/2015) 7 tablet 0     No Known Allergies  History of present pregnancy: Pt Info/Preference:  Screening/Consents:  Labs:   EDD: Estimated Date of Delivery: 04/17/20  Establised: Patient's last menstrual period was 07/12/2019.  Anatomy Scan: Date: 12/04/2019 Placenta Location: anterior, x2 VC Genetic Screen: Panoroma:low risk female AFP:  First Tri: Quad:  Office: [facility]             First PNV: 10.6 wg Blood Type A/Positive/-- (11/02 0000)  Language: english Last PNV: 37.6 wg Rhogam    Flu Vaccine:  declined   Antibody Negative (11/02 0000)  TDaP vaccine declined   GTT: Early: 5.7 hga1c Third Trimester: has x3 1HGG and failed twice with 143-139  Feeding Plan: Breast/bottle BTL: no Rubella: Immune (11/02 0000)  Contraception: ??? VBAC: no RPR: Nonreactive (11/02 0000)   Circumcision: N/A   HBsAg: Negative (11/02 0000)  Pediatrician:  ???   HIV: Non-reactive (11/02 0000)   Prenatal Classes: no Additional [place]: 3/31 efw see below, BPP 4/8 was 8/8 GBS:  (For PCN allergy, check sensitivities)       Chlamydia: neg    MFM Referral/Consult:  GC: neg  Support Person: husband   PAP: ???  Pain Management: None possible epidural Neonatologist Referral:  Hgb Electrophoresis:  AA  Birth Plan: none   Hgb NOB: 11.7    28W: 11.2  3/31 growth  OB History    Gravida  5   Para  3   Term  3   Preterm      AB  1   Living  3     SAB  1   TAB      Ectopic      Multiple      Live Births  3          Past Medical History:  Diagnosis Date  . Gestational diabetes   . Opiate addiction (HCC)   . Single vessel of umbilical cord   . SVT (supraventricular tachycardia) (HCC)    resolved since discontinuing opiate abuse   Past Surgical History:  Procedure Laterality Date  . NO PAST SURGERIES     Family History: family history includes Diabetes in her mother;  Hypertension in her mother. Social History:  reports that she has been smoking cigarettes. She has been smoking about 1.00 pack per day. She has never used smokeless tobacco. She reports that she does not drink alcohol or use drugs.   Prenatal Transfer Tool  Maternal Diabetes: Yes:  Diabetes Type:  Diet controlled Genetic Screening: Normal Maternal Ultrasounds/Referrals: Other:2VC Fetal Ultrasounds or other Referrals:  None, Referred to Materal Fetal Medicine  Maternal Substance Abuse:  No H/O opioid x7 years ago last use.  Significant Maternal Medications:  None Significant Maternal Lab Results: Group B Strep negative  ROS:  ROS   Physical Exam:  BP 131/79   Pulse (!) 101   Temp 97.8 F (36.6 C)   Resp 19   Ht 5\' 2"  (1.575 m)   Wt 108.4 kg   LMP 07/12/2019   SpO2 100%   BMI 43.71 kg/m   Physical Exam   NST: FHR baseline 130 bpm, Variability: moderate, Accelerations:present, Decelerations:  Absent= Cat 1/Reactive UC:   irregular, every 2-8 minutes SVE:   Dilation: 2 Effacement (%): 60 Station: -3, Ballotable Exam by:: [email or web address], vertex verified by fetal sutures.  Leopold's: Position vertex, EFW 7lbs via leopold's.  Bedside [employer] verified vertex.   Labs: Results for orders placed or performed during the hospital encounter of 04/03/20 (from the past 24 hour(s))  Protein / creatinine ratio, urine     Status: Abnormal   Collection Time: 04/03/20  6:01 AM  Result Value Ref Range   Creatinine, Urine 89.85 mg/dL   Total Protein, Urine 14 mg/dL   Protein Creatinine Ratio 0.16 (H) 0.00 - 0.15 mg/mg[Cre]  CBC     Status: Abnormal   Collection Time: 04/03/20  6:30 AM  Result Value Ref Range   WBC 12.8 (H) 4.0 - 10.5 K/uL   RBC 4.02 3.87 - 5.11 MIL/uL   Hemoglobin 12.2 12.0 - 15.0 g/dL   HCT 04/05/20 67.6 - 19.5 %   MCV 92.3 80.0 - 100.0 fL   MCH 30.3 26.0 - 34.0 pg   MCHC 32.9 30.0 - 36.0 g/dL   RDW 09.3 26.7 - 12.4 %   Platelets 392 150 - 400 K/uL   nRBC 0.0 0.0 - 0.2 %   Comprehensive metabolic panel     Status: Abnormal   Collection Time: 04/03/20  6:30 AM  Result Value Ref Range   Sodium 136 135 - 145 mmol/L   Potassium 3.7 3.5 - 5.1 mmol/L   Chloride 105 98 - 111 mmol/L   CO2 20 (L) 22 - 32 mmol/L   Glucose, Bld 89 70 - 99 mg/dL   BUN 8 6 - 20 mg/dL   Creatinine, Ser 04/05/20 0.44 - 1.00 mg/dL   Calcium 8.9 8.9 - 9.98 mg/dL   Total Protein 5.9 (L) 6.5 - 8.1 g/dL   Albumin 2.7 (L) 3.5 - 5.0 g/dL   AST 17 15 - 41 U/L   ALT 17 0 - 44 U/L   Alkaline Phosphatase 140 (H) 38 - 126 U/L   Total Bilirubin 0.2 (L) 0.3 - 1.2 mg/dL   GFR calc non Af Amer >60 >60 mL/min   GFR calc Af Amer >60 >60 mL/min   Anion gap 11 5 - 15    Imaging:  No results found.  MAU Course: Orders Placed This Encounter  Procedures  . SARS CORONAVIRUS 2 (TAT 6-24 HRS) Nasopharyngeal Nasopharyngeal Swab  . CBC  . Comprehensive metabolic panel  . Protein / creatinine ratio, urine  . CBC  . RPR  . Diet regular Room service appropriate? Yes; Fluid consistency: Thin  . Vitals signs per unit policy  . Notify Physician  . Fetal monitoring per unit policy  . Activity as tolerated  . Cervical Exam  . Measure blood pressure post delivery every 15 min x 1 hour then every 30 min x 1 hour  . Fundal check post delivery every 15 min x 1 hour then every 30 min x 1 hour  . If Rapid HIV test positive or known HIV positive: initiate AZT orders  . May in and out cath x 2 for inability to void  .  Insert foley catheter  . Discontinue foley prior to vaginal delivery  . Initiate Carrier Fluid Protocol  . Initiate Oral Care Protocol  . Order Rapid HIV per protocol if no results on chart  . Informed Consent Details: Physician/Practitioner Attestation; Transcribe to consent form and obtain patient signature  . Patient may have epidural placement upon request  . Evaluate fetal heart rate to establish reassuring pattern prior to initiating Cytotec or Pitocin  . Perform a cervical exam prior to  initiating Cytotec or Pitocin  . Discontinue Pitocin if tachysystole with non-reassuring FHR is present  . Nofify MD/CNM if tachysystole with non-reassuring FHR is present  . Initiate intrauterine resuscitation if tachysystole with non-reasuring FHR is present  . If tachysystole WITH reassuring FHR present notify MD / CNM  . May administer Terbutaline 0.25 mg SQ x 1 dose if tachysystole with non-reassuring FHR is presesnt  . Labor Augmentation  . Full code  . Type and screen [facility]  . Insert and maintain IV Line  . Admit to Inpatient (patient's expected length of stay will be greater than 2 midnights or inpatient only procedure)   Meds ordered this encounter  Medications  . lactated ringers infusion  . oxytocin (PITOCIN) IV BOLUS FROM BAG  . oxytocin (PITOCIN) IV infusion 40 units in NS 1000 mL - Premix  . lactated ringers infusion 500-1,000 mL  . acetaminophen (TYLENOL) tablet 650 mg  . oxyCODONE-acetaminophen (PERCOCET/ROXICET) 5-325 MG per tablet 1 tablet  . oxyCODONE-acetaminophen (PERCOCET/ROXICET) 5-325 MG per tablet 2 tablet  . ondansetron (ZOFRAN) injection 4 mg  . sodium citrate-citric acid (ORACIT) solution 30 mL  . lidocaine (PF) (XYLOCAINE) 1 % injection 30 mL  . terbutaline (BRETHINE) injection 0.25 mg  . oxytocin (PITOCIN) IV infusion 40 units in NS 1000 mL - Premix    Order Specific Question:   Begin infusion at:    Answer:   2 milli-units/min (3 mL/hr)    Order Specific Question:   Increase infusion by:    Answer:   2 milli-units/min (3 mL/hr)    Assessment/Plan: Yolanda Holmes is a 44 y.o. female, G5P3013, IUP at 38 weeks, presenting for new onset GHTN, admitted to see yellow lights in corners of eyes, not currently, with elevated BP, not severe range. Per Dr Sallye Ober pt to be admitted for [facility] with severe feature, declined to start magnesium at this time. PreE labs unremakable. Pt endorses to H/O opioid use disorder, last use x7 years ago (During  opoid/cocaine addiction, none since, negative cardiology w/u in past.). Prenatal history significant for GDMA2 none compliment with glyburide, pt denies ever taking and endorses diet controlled. EFW 3/31 5.10lbs. 2VC. GBS-. Baby female. Pt endorse + Fm. Denies vaginal leakage. Denies vaginal bleeding. Denies feeling cxt's.   FWB: Cat 1 Fetal Tracing.   Plan: Admit to [facility] per consult with Dr Sallye Ober [facility]: monitor BP, labetalol protocol if BP >160/110.  GDMA1-2: Non compliment with glyburide, monitor CBG @4H , in active labor Q2H.  Routine [facility] orders Pain med/epidural prn Anticipate labor progression   [facility] NP-C, CNM, MSN 04/03/2020, 8:22 AM  MD Addendum: Patient meets criteria for gestational hypertension only, no severe features as her vision changes have resolved.  Will proceed with induction of labor. Will do a urine drug screen for history of cocaine and opiates usage.  Category 1 tracing noted at 1600.     Dr. 04/05/2020.  04/03/2020. 1620.

## 2020-04-03 NOTE — MAU Provider Note (Signed)
Chief Complaint:  Contractions   First Provider Initiated Contact with Patient 04/03/20 [phone]      HPI: Yolanda Holmes is a 44 y.o. G5P3013 at 58w0dwho presents to maternity admissions reporting painful uterine contractions and bloody show.  Upon assessment, BPs were noted to be elevated, so we will evaluate for preeclampsia.  She does endorse seeing spots in her vision, even at rest. No H/A She reports good fetal movement, denies LOF, vaginal bleeding, vaginal itching/burning, urinary symptoms, h/a, dizziness, n/v, diarrhea, constipation or fever/chills.  She denies RUQ abdominal pain.  Abdominal Pain This is a recurrent problem. The current episode started today. The problem occurs intermittently. The quality of the pain is cramping. The abdominal pain does not radiate. Pertinent negatives include no constipation, diarrhea, dysuria, fever, frequency or headaches. Nothing aggravates the pain. The pain is relieved by nothing. She has tried nothing for the symptoms.  Hypertension This is a new problem. The current episode started today. Associated symptoms include blurred vision (seeing spots/flashes) and peripheral edema. Pertinent negatives include no anxiety, headaches, malaise/fatigue or palpitations. There are no associated agents to hypertension. There are no known risk factors for coronary artery disease. Past treatments include nothing. There are no compliance problems.    RN note: Ctxs since 20mn. Scant bloody show. 2cm last sve. Denies LOF. GDM diet control    Past Medical History: Past Medical History:  Diagnosis Date  . Gestational diabetes   . Opiate addiction (HCC)   . Single vessel of umbilical cord   . SVT (supraventricular tachycardia) (HCC)    resolved since discontinuing opiate abuse    Past obstetric history: OB History  Gravida Para Term Preterm AB Living  5 3 3   1 3   SAB TAB Ectopic Multiple Live Births  1       3    # Outcome Date GA Lbr Len/2nd Weight Sex  Delivery Anes PTL Lv  5 Current           4 Term 01/22/09 [redacted]w[redacted]d    Vag-Spont   LIV  3 Term 05/28/07 [redacted]w[redacted]d    Vag-Spont   LIV  2 Term 03/19/03 [redacted]w[redacted]d    Vag-Spont   LIV  1 SAB 10/20/01 [redacted]w[redacted]d           Past Surgical History: Past Surgical History:  Procedure Laterality Date  . NO PAST SURGERIES      Family History: Family History  Problem Relation Age of Onset  . Hypertension Mother   . Diabetes Mother     Social History: Social History   Tobacco Use  . Smoking status: Current Every Day Smoker    Packs/day: 1.00    Types: Cigarettes  . Smokeless tobacco: Never Used  Substance Use Topics  . Alcohol use: No  . Drug use: No    Comment: former opiate abuser    Allergies: No Known Allergies  Meds:  Medications Prior to Admission  Medication Sig Dispense Refill Last Dose  . Prenatal Vit-Fe Fumarate-FA (PRENATAL MULTIVITAMIN) TABS tablet Take 1 tablet by mouth daily at 12 noon.   04/02/2020 at Unknown time  . albuterol (PROVENTIL HFA;VENTOLIN HFA) 108 (90 BASE) MCG/ACT inhaler Inhale 2 puffs into the lungs every 4 (four) hours as needed for wheezing or shortness of breath. (Patient not taking: Reported on 12/04/2015) 1 Inhaler 0   . aspirin 325 MG tablet Take 325 mg by mouth every 6 (six) hours as needed for mild pain.     . benzonatate (TESSALON) 100  MG capsule Take 1 capsule (100 mg total) by mouth every 8 (eight) hours. (Patient not taking: Reported on 12/04/2015) 16 capsule 0   . ciprofloxacin (CILOXAN) 0.3 % ophthalmic ointment Apply 1/2 inch to left eye q3 hours while awake. 3.5 g 0   . ibuprofen (ADVIL,MOTRIN) 800 MG tablet Take 1 tablet (800 mg total) by mouth every 8 (eight) hours as needed. (Patient not taking: Reported on 12/04/2015) 30 tablet 0   . naproxen (NAPROSYN) 500 MG tablet Take 1 tablet (500 mg total) by mouth 2 (two) times daily. (Patient not taking: Reported on 12/04/2015) 12 tablet 0   . omeprazole (PRILOSEC) 20 MG capsule Take 1 capsule (20 mg total) by  mouth daily. (Patient not taking: Reported on 12/04/2015) 5 capsule 0   . predniSONE (DELTASONE) 50 MG tablet Take 1 tablet (50 mg total) by mouth daily with breakfast. (Patient not taking: Reported on 12/04/2015) 7 tablet 0     I have reviewed patient's Past Medical Hx, Surgical Hx, Family Hx, Social Hx, medications and allergies.   ROS:  Review of Systems  Constitutional: Negative for fever and malaise/fatigue.  Eyes: Positive for blurred vision (seeing spots/flashes).  Cardiovascular: Negative for palpitations.  Gastrointestinal: Positive for abdominal pain. Negative for constipation and diarrhea.  Genitourinary: Negative for dysuria and frequency.  Neurological: Negative for headaches.   Other systems negative  Physical Exam   Patient Vitals for the past 24 hrs:  BP Temp Pulse Resp SpO2 Height Weight  04/03/20 0548 (!) 139/92 -- (!) 102 (!) 26 -- -- --  04/03/20 0545 (!) 145/86 -- (!) 108 -- 100 % -- --  04/03/20 0524 (!) 143/95 97.8 F (36.6 C) (!) 102 20 -- 5\' 2"  (1.575 m) 108.4 kg   Vitals:   04/03/20 0616 04/03/20 0631 04/03/20 0646 04/03/20 0701  BP: 138/82 125/78 137/86 131/79  Pulse: 97 (!) 104 95 (!) 101  Resp:  19    Temp:      SpO2:      Weight:      Height:        Constitutional: Well-developed, well-nourished female in no acute distress.  Cardiovascular: normal rate and rhythm Respiratory: normal effort, clear to auscultation bilaterally GI: Abd soft, non-tender, gravid appropriate for gestational age.   No rebound or guarding. MS: Extremities nontender, no edema, normal ROM Neurologic: Alert and oriented x 4.  GU: Neg CVAT.  PELVIC EXAM:   Dilation: 1 Exam by:: [email or web address] RN   Recheck: Dilation: 2 Effacement (%): 60 Cervical Position: Middle Station: -3, Ballotable Presentation: Vertex Exam by:: [email or web address] CNM  FHT:  Baseline 135 , moderate variability, accelerations present, no decelerations Contractions:  Irregular     Labs: Results for  orders placed or performed during the hospital encounter of 04/03/20 (from the past 24 hour(s))  Protein / creatinine ratio, urine     Status: Abnormal   Collection Time: 04/03/20  6:01 AM  Result Value Ref Range   Creatinine, Urine 89.85 mg/dL   Total Protein, Urine 14 mg/dL   Protein Creatinine Ratio 0.16 (H) 0.00 - 0.15 mg/mg[Cre]  CBC     Status: Abnormal   Collection Time: 04/03/20  6:30 AM  Result Value Ref Range   WBC 12.8 (H) 4.0 - 10.5 K/uL   RBC 4.02 3.87 - 5.11 MIL/uL   Hemoglobin 12.2 12.0 - 15.0 g/dL   HCT 04/05/20 70.6 - 23.7 %   MCV 92.3 80.0 - 100.0 fL   MCH 30.3 26.0 -  34.0 pg   MCHC 32.9 30.0 - 36.0 g/dL   RDW 12.8 11.5 - 15.5 %   Platelets 392 150 - 400 K/uL   nRBC 0.0 0.0 - 0.2 %  Comprehensive metabolic panel     Status: Abnormal   Collection Time: 04/03/20  6:30 AM  Result Value Ref Range   Sodium 136 135 - 145 mmol/L   Potassium 3.7 3.5 - 5.1 mmol/L   Chloride 105 98 - 111 mmol/L   CO2 20 (L) 22 - 32 mmol/L   Glucose, Bld 89 70 - 99 mg/dL   BUN 8 6 - 20 mg/dL   Creatinine, Ser 0.61 0.44 - 1.00 mg/dL   Calcium 8.9 8.9 - 10.3 mg/dL   Total Protein 5.9 (L) 6.5 - 8.1 g/dL   Albumin 2.7 (L) 3.5 - 5.0 g/dL   AST 17 15 - 41 U/L   ALT 17 0 - 44 U/L   Alkaline Phosphatase 140 (H) 38 - 126 U/L   Total Bilirubin 0.2 (L) 0.3 - 1.2 mg/dL   GFR calc non Af Amer >60 >60 mL/min   GFR calc Af Amer >60 >60 mL/min   Anion gap 11 5 - 15    A/Positive/-- (11/02 0000)  Imaging:  No results found.  MAU Course/MDM: I have ordered labs and reviewed results. Labs are normal NST reviewed, reactive Consult Dr Alesia Richards with presentation, exam findings and test results. She advises admission for delivery Even though labs are normal and BPs have improved, the visual changes and new hypertension indicate Gestational Hypertension with latent labor, so will admit  Assessment: Single IUP at [redacted]w[redacted]d Uterine contractions in pregnancy Gestational Hypertension  Plan: Admit [facility] to  follow  Hansel Feinstein CNM, MSN Certified Nurse-Midwife 04/03/2020 5:56 AM

## 2020-04-04 LAB — GLUCOSE, CAPILLARY
Glucose-Capillary: 75 mg/dL (ref 70–99)
Glucose-Capillary: 91 mg/dL (ref 70–99)
Glucose-Capillary: 65 mg/dL — ABNORMAL LOW (ref 70–99)

## 2020-04-04 MED ORDER — SENNOSIDES-DOCUSATE SODIUM 8.6-50 MG PO TABS
2.0000 | ORAL_TABLET | ORAL | Status: DC
  Filled 2020-04-04: qty 2

## 2020-04-04 MED ORDER — DIPHENHYDRAMINE HCL 25 MG PO CAPS: 25.0000 mg | ORAL_CAPSULE | Freq: Four times a day (QID) | ORAL | Status: DC | PRN

## 2020-04-04 MED ORDER — ONDANSETRON HCL 4 MG PO TABS: 4.0000 mg | ORAL_TABLET | ORAL | Status: DC | PRN

## 2020-04-04 MED ORDER — ALBUTEROL SULFATE (2.5 MG/3ML) 0.083% IN NEBU: 3.0000 mL | INHALATION_SOLUTION | RESPIRATORY_TRACT | Status: DC | PRN

## 2020-04-04 MED ORDER — ZOLPIDEM TARTRATE 5 MG PO TABS: 5.0000 mg | ORAL_TABLET | Freq: Every evening | ORAL | Status: DC | PRN

## 2020-04-04 MED ORDER — SIMETHICONE 80 MG PO CHEW: 80.0000 mg | CHEWABLE_TABLET | ORAL | Status: DC | PRN

## 2020-04-04 MED ORDER — ONDANSETRON HCL 4 MG/2ML IJ SOLN: 4.0000 mg | INTRAMUSCULAR | Status: DC | PRN

## 2020-04-04 MED ORDER — ACETAMINOPHEN 325 MG PO TABS: 650.0000 mg | ORAL_TABLET | ORAL | Status: DC | PRN

## 2020-04-04 MED ORDER — DIBUCAINE (PERIANAL) 1 % EX OINT: 1.0000 "application " | TOPICAL_OINTMENT | CUTANEOUS | Status: DC | PRN

## 2020-04-04 MED ORDER — BENZOCAINE-MENTHOL 20-0.5 % EX AERO: 1.0000 "application " | INHALATION_SPRAY | CUTANEOUS | Status: DC | PRN

## 2020-04-04 MED ORDER — WITCH HAZEL-GLYCERIN EX PADS: 1.0000 "application " | MEDICATED_PAD | CUTANEOUS | Status: DC | PRN

## 2020-04-04 MED ORDER — OXYTOCIN 40 UNITS IN NORMAL SALINE INFUSION - SIMPLE MED
1.0000 m[IU]/min | INTRAVENOUS | Status: DC
  Administered 2020-04-04: 2 m[IU]/min via INTRAVENOUS
  Filled 2020-04-04: qty 1000

## 2020-04-04 MED ORDER — COCONUT OIL OIL: 1.0000 "application " | TOPICAL_OIL | Status: DC | PRN

## 2020-04-04 MED ORDER — TETANUS-DIPHTH-ACELL PERTUSSIS 5-2.5-18.5 LF-MCG/0.5 IM SUSP: 0.5000 mL | Freq: Once | INTRAMUSCULAR | Status: DC

## 2020-04-04 MED ORDER — IBUPROFEN 600 MG PO TABS
600.0000 mg | ORAL_TABLET | Freq: Four times a day (QID) | ORAL | Status: DC
  Administered 2020-04-04 – 2020-04-05 (×2): 600 mg via ORAL
  Filled 2020-04-04 (×3): qty 1

## 2020-04-04 MED ORDER — PRENATAL MULTIVITAMIN CH: 1.0000 | ORAL_TABLET | Freq: Every day | ORAL | Status: DC

## 2020-04-04 NOTE — Progress Notes (Signed)
Pt informed RN at shift change of BTL planned for tomorrow. PT not on OR schedule. RN called Harriett Sine CNM to verify BTL, per CNM: inpatient BTL cancelled d/t consent being signed <30 days ago. Pt and OR charge nurse made aware.   Patrica Duel, RN 04/04/2020 9:52 PM

## 2020-04-04 NOTE — Progress Notes (Signed)
SVD baby girl, skin to skin with mother °

## 2020-04-04 NOTE — Progress Notes (Signed)
Labor Progress Note  Sirenity Shew is a 44 y.o. female, G5P3013, IUP at 38 weeks, presenting for new onset GHTN, admitted to see yellow lights in corners of eyes, not currently, with elevated BP, not severe range. Per Dr Sallye Ober pt to be admitted for [facility] with severe feature, declined to start magnesium at this time. PreE labs unremakable. Pt endorses to H/O opioid use disorder, last use x7 years ago (During opoid/cocaine addiction, none since, negative cardiology w/u in past.). Prenatal history significant for GDMA2 none compliment with glyburide, pt denies ever taking and endorses diet controlled. EFW 3/31 5.10lbs. 2VC. GBS-. Baby female.   Subjective: Pt resting well, feeling cxt but tolerated well, no further complaints.  Patient Active Problem List   Diagnosis Date Noted  . Gestational hypertension 04/03/2020  . Supervision of high risk pregnancy, antepartum 10/24/2019   Objective: BP 119/79   Pulse 90   Temp 98.9 F (37.2 C) (Oral)   Resp 17   Ht 5\' 2"  (1.575 m)   Wt 108.4 kg   LMP 07/12/2019   SpO2 100%   BMI 43.71 kg/m  No intake/output data recorded. No intake/output data recorded. NST: FHR baseline 150 bpm, Variability: moderate, Accelerations:present, Decelerations:  Absent= Cat 1/Reactive CTX:  irregular, every 2-4 minutes Uterus gravid, soft non tender, moderate to palpate with contractions.  SVE:  Dilation: 2 Effacement (%): 60 Station: Ballotable Exam by:: [email or web address], RN Pitocin (1) mUn/min  Madilyn Fireman placed with ease after pt verbalized permission.   Assessment:  Ettel Albergo is a 44 y.o. female, G5P3013, IUP at 38 weeks, presenting for new onset GHTN, admitted to see yellow lights in corners of eyes, not currently, with elevated BP, not severe range. Per Dr 45 pt to be admitted for [facility] with severe feature, declined to start magnesium at this time. PreE labs unremakable. Pt endorses to H/O opioid use disorder, last use x7 years ago (During opoid/cocaine addiction,  none since, negative cardiology w/u in past.). Prenatal history significant for GDMA2 none compliment with glyburide, pt denies ever taking and endorses diet controlled. EFW 3/31 5.10lbs. 2VC. GBS-. Baby female. Not progressing, not in labor, stopped pitocin due to needing cervical ripening. Ripening occurring with buccal Cytotec, pt effaced a little more form 50-60. Currently foley bulb placed with low dose pit to start, pt was cxt to many time for be to feel comfortable placing another cytotex.  Patient Active Problem List   Diagnosis Date Noted  . Gestational hypertension 04/03/2020  . Supervision of high risk pregnancy, antepartum 10/24/2019   NICHD: Category 1  Membranes:  Intact, no s/s of infection  Induction:    Cytotec x 13/03/2019 given buccally @ 1459, and 25 mcg given @ 2000  Foley: cook cath placed with in each balloon, tolerated well.   Pitocin - 1   Pain management:               IV pain management: x Declined, pt has substance use disorder and prefers to abstains from taking  meds.              Epidural placement:  PRN but declined for now.   GBS Negative  GHTN: 119/79, asymptomatic now, neg preE labs.   A1GDM: stable Q4H CBG monitoring.  CBG (last 3)  Recent Labs    04/03/20 0853 04/03/20 1907 04/03/20 2321  GLUCAP 86 153* 87   Plan: Continue labor plan Continuous monitoring Rest Ambulate DMA2: Monitor cbg Q4 h and Q2H in active labor.  GHTN: Monitor BP, start IV labetalol if Bps >160/110, plan to start mag if severe range BP occur or worsen in neuro s/sx.  Frequent position changes to facilitate fetal rotation and descent. Start pitocin 1x1 until max of 6 for cervical ripening until foley out then increase 2x2  Once cervix ripen plan for AROM and pitocin.  Anticipate labor progression and vaginal delivery.   Noralyn Pick, NP-C, CNM, MSN 04/04/2020. 12:04 AM

## 2020-04-04 NOTE — Progress Notes (Signed)
Subjective: Pt still comfortable.  Having some slight cramping.  Pitocin not started last night.  Objective: BP 131/75   Pulse 84   Temp 98.6 F (37 C) (Oral)   Resp 16   Ht 5\' 2"  (1.575 m)   Wt 108.4 kg   LMP 07/12/2019   SpO2 100%   BMI 43.71 kg/m  No intake/output data recorded. No intake/output data recorded.  FHT: Category 1 FHT 135 accels no decels UC:   irregular, every 2-4 minutes SVE:   Dilation: 5.5 Effacement (%): 60 Station: -3 Exam by:: [email or web address], RN Pitocin at just started   Assessment:  44 yo G5P3013 at 38.1 IUP induction for GHTN and floaters in eye induction GDMA1-2 mpm compliant Cat 1 strip  Plan: Restart pitocin AROM when head engaged Anticipate SVD  45 CNM, MSN 04/04/2020, 9:44 AM

## 2020-04-04 NOTE — Lactation Note (Addendum)
This note was copied from a baby's chart. Lactation Consultation Note  Patient Name: Girl Charma Mocarski JJKKX'F Date: 04/04/2020 Reason for consult: Initial assessment;Infant < 6lbs;Maternal endocrine disorder Type of Endocrine Disorder?: Diabetes Mom reports it has been 11 years since she breast fed.  Baby girl [place] Caster now 5 hours old.  Infant less than 6 pounds and SGA. Mom reports she decided to give her formula and requested higher calorie formula due to Marlie's size and they would really like to go home at 24 hours.  Mom reports she would really like to breastfed, but plans to start once her milk is in and [place] has started to gain some weight. Discussed how to have a good milk supply for later. Urged mom to pump every time she gives a bottle.  Explained to mom it would be for stimulation to have good supply for letter that most moms don't get much with pumping at first. Mom in agreement.  Mom reports she would like to go outside and get some fresh air and requested lactation to come back in about 30 minutes  Maternal Data Does the patient have breastfeeding experience prior to this delivery?: Yes  Feeding Feeding Type: Formula Nipple Type: Slow - flow  LATCH Score                   Interventions Interventions: Breast feeding basics reviewed  Lactation Tools Discussed/Used WIC Program: No   Consult Status Consult Status: Follow-up Date: 04/04/20 Follow-up type: In-patient    Crytal Pensinger Michaelle Copas 04/04/2020, 8:40 PM

## 2020-04-04 NOTE — Progress Notes (Signed)
Subjective: Breathing with contractions  Objective: BP (!) 142/72   Pulse 88   Temp 97.7 F (36.5 C) (Oral)   Resp 17   Ht 5\' 2"  (1.575 m)   Wt 108.4 kg   LMP 07/12/2019   SpO2 100%   BMI 43.71 kg/m  No intake/output data recorded. No intake/output data recorded. CBG 65 FHT: Category 1  FHT 130 accels no decels UC:   regular, every 3-5 minutes SVE:   Dilation: 5.5 Effacement (%): 60 Station: -3 Exam by:: [place] CNM Pitocin at 6 mu   Assessment:  44 yo G5P3013 at 38.1 IUP induction for GHTN and floaters in eye induction GDMA1-2 mpm compliant Cat 1 strip AROM clear fluid large amount of fluid, compound hand presentation noted. Plan: Monitor progress Increase pitocin Anticipate SVD  45 CNM, MSN 04/04/2020, 12:22 PM

## 2020-04-04 NOTE — Lactation Note (Signed)
This note was copied from a baby's chart. Lactation Consultation Note  Patient Name: Girl Lilian Fuhs [id number] Date: 04/04/2020 Reason for consult: Initial assessment;Infant < 6lbs;Maternal endocrine disorder Type of Endocrine Disorder?: Diabetes Followed up with mom regarding initiating pumping.  Mom had reported had never used a multiuser hospital grade pump.  However mom sleeping now.  Left message for RN to assist mom  In initiating pumping this pm.  Maternal Data Does the patient have breastfeeding experience prior to this delivery?: Yes  Feeding    LATCH Score                   Interventions Interventions: Breast feeding basics reviewed  Lactation Tools Discussed/Used WIC Program: No   Consult Status Consult Status: Follow-up Date: 04/04/20 Follow-up type: In-patient    [facility] Michaelle Copas 04/04/2020, 10:46 PM

## 2020-04-04 NOTE — Progress Notes (Signed)
Labor Progress Note  Yolanda Holmes is a 44 y.o. female, G5P3013, IUP at 19 weeks, presenting for new onset GHTN, admitted to see yellow lights in corners of eyes, not currently, with elevated BP, not severe range. Per Dr Alesia Richards pt to be admitted for [facility] with severe feature, declined to start magnesium at this time. PreE labs unremakable. Pt endorses to H/O opioid use disorder, last use x7 years ago (During opoid/cocaine addiction, none since, negative cardiology w/u in past.). Prenatal history significant for GDMA2 none compliment with glyburide, pt denies ever taking and endorses diet controlled. EFW 3/31 5.10lbs. [place]. GBS-. Baby female.   Subjective: Pt resting well, still feeling cxt but tolerated well, no further complaints.  Patient Active Problem List   Diagnosis Date Noted  . Gestational hypertension 04/03/2020  . Supervision of high risk pregnancy, antepartum 10/24/2019   Objective: BP 125/72   Pulse 87   Temp 98.2 F (36.8 C) (Oral)   Resp 16   Ht 5\' 2"  (1.575 m)   Wt 108.4 kg   LMP 07/12/2019   SpO2 100%   BMI 43.71 kg/m  No intake/output data recorded. No intake/output data recorded. NST: FHR baseline 130 bpm, Variability: moderate, Accelerations:present, Decelerations:  Absent= Cat 1/Reactive CTX:  irregular, every 2-4 minutes Uterus gravid, soft non tender, moderate to palpate with contractions.  SVE:  Dilation: 5.5 Effacement (%): 60 Station: -3 Exam by:: Buckner [place], RN Pitocin (10) mUn/min  Assessment:  Yolanda Holmes is a 44 y.o. female, G5P3013, IUP at 69 weeks, presenting for new onset GHTN, admitted to see yellow lights in corners of eyes, not currently, with elevated BP, not severe range. Per Dr Alesia Richards pt to be admitted for [facility] with severe feature, declined to start magnesium at this time. PreE labs unremakable. Pt endorses to H/O opioid use disorder, last use x7 years ago (During opoid/cocaine addiction, none since, negative cardiology w/u in past.). Prenatal history  significant for GDMA2 none compliment with glyburide, pt denies ever taking and endorses diet controlled. EFW 3/31 5.10lbs. [place]. GBS-. Baby female. Progressing in latent labor on pitocin.  Patient Active Problem List   Diagnosis Date Noted  . Gestational hypertension 04/03/2020  . Supervision of high risk pregnancy, antepartum 10/24/2019   NICHD: Category 1  Membranes:  Intact, no s/s of infection  Induction:    Cytotec x 56mcg given buccally @ 1459, and 25 mcg given @ 2000  Foley: cook cath placed @0000  out @ 0400  Pitocin - 10   Pain management:               IV pain management: x Declined, pt has substance use disorder and prefers to abstains from taking  meds.              Epidural placement:  PRN but declined for now.   GBS Negative  GHTN: 125/72, asymptomatic now, neg preE labs.   A1GDM: stable Q4H CBG monitoring.  CBG (last 3)  Recent Labs    04/03/20 1907 04/03/20 2321 04/04/20 0352  GLUCAP 153* 87 75   Plan: Continue labor plan Continuous monitoring Rest Ambulate DMA2: Monitor cbg Q4 h and Q2H in active labor.  GHTN: Monitor BP, start IV labetalol if Bps >160/110, plan to start mag if severe range BP occur or worsen in neuro s/sx.  Frequent position changes to facilitate fetal rotation and descent. Start pitocin 1x1 until max of 6 for cervical ripening until foley out then increase 2x2  Once cervix ripen plan for  AROM and pitocin.  Anticipate labor progression and vaginal delivery.   Dale La Grulla, NP-C, CNM, MSN 04/04/2020. 6:00 AM

## 2020-04-05 ENCOUNTER — Encounter (HOSPITAL_COMMUNITY)
Admission: AD | Disposition: A | Discharge status: Home / Self Care | Payer: Self-pay | Source: Home / Self Care | Attending: Obstetrics & Gynecology

## 2020-04-05 DIAGNOSIS — O24419 Gestational diabetes mellitus in pregnancy, unspecified control: Secondary | ICD-10-CM | POA: Diagnosis present

## 2020-04-05 DIAGNOSIS — Z87898 Personal history of other specified conditions: Secondary | ICD-10-CM | POA: Insufficient documentation

## 2020-04-05 LAB — CBC
HCT: 32.7 % — ABNORMAL LOW (ref 36.0–46.0)
Hemoglobin: 10.7 g/dL — ABNORMAL LOW (ref 12.0–15.0)
MCH: 30.5 pg (ref 26.0–34.0)
MCHC: 32.7 g/dL (ref 30.0–36.0)
MCV: 93.2 fL (ref 80.0–100.0)
Platelets: 364 10*3/uL (ref 150–400)
RBC: 3.51 MIL/uL — ABNORMAL LOW (ref 3.87–5.11)
RDW: 12.9 % (ref 11.5–15.5)
WBC: 13.7 10*3/uL — ABNORMAL HIGH (ref 4.0–10.5)
nRBC: 0 % (ref 0.0–0.2)

## 2020-04-05 SURGERY — LIGATION, FALLOPIAN TUBE, POSTPARTUM
Anesthesia: Choice

## 2020-04-05 NOTE — Lactation Note (Addendum)
This note was copied from a baby's chart. Lactation Consultation Note  Patient Name: Yolanda Holmes [id number] Date: 04/05/2020 Reason for consult: Follow-up assessment;Other (Comment);Infant < 6lbs;Early term 37-38.6wks Type of Endocrine Disorder?: Diabetes  LC student entered room and MOB laying in bed with Infant, who was asleep. Support person present.  [facility] student inquired about how feeding was going. MOB affirmed a history of breastfeeding with older children including an 1 year old.  MOB reports that she is pumping after feeding. Sometimes she is bottle feeding in lieu of the breast. LC student educated on: supply and demand, baby belly size, skin to skin, feeding on demand.   MOB affirmed that all of their questions were answered at this time and know to call Lactation, as needed.   Feeding Plan 1. Place baby skin to skin 2. Offer baby breast often 3. Stimulate breast with pump q3 hours  4. Supplement with formula, as needed.   Maternal Data Does the patient have breastfeeding experience prior to this delivery?: Yes  Feeding Feeding Type: Breast Milk with Formula added  Consult Status Consult Status: Follow-up Date: 04/06/20 Follow-up type: In-patient     Yolanda Holmes 04/05/2020, 2:46 PM

## 2020-04-05 NOTE — Discharge Summary (Signed)
SVD OB Discharge Summary     Patient Name: Yolanda Holmes DOB: 12/04/1976 MRN: [id number]  Date of admission: 04/03/2020 Delivering MD: Kenney Houseman  Date of delivery: 04/04/2020 Type of delivery: SVD  Newborn Data: Sex: Baby female  Live born female  Birth Weight: 5 lb 12.2 oz (2615 g) APGAR: 8, 9  Newborn Delivery   Birth date/time: 04/04/2020 14:52:00 Delivery type: Vaginal, Spontaneous      Feeding: breast Infant being discharge to home with mother in stable condition.   Admitting diagnosis: Gestational hypertension [O13.9] Intrauterine pregnancy: [redacted]w[redacted]d     Secondary diagnosis:  Active Problems:   Gestational hypertension   SVD (spontaneous vaginal delivery)   GDM (gestational diabetes mellitus)   Normal postpartum course                                Complications: None                                                              Intrapartum Procedures: spontaneous vaginal delivery Postpartum Procedures: none Complications-Operative and Postpartum: none Augmentation: AROM, Pitocin, Cytotec and Foley Balloon   History of Present Illness: Yolanda Holmes is a 44 y.o. female, [phone], who presents at [redacted]w[redacted]d weeks gestation. The patient has been followed at  [facility]  Her pregnancy has been complicated by:  Patient Active Problem List   Diagnosis Date Noted  . GDM (gestational diabetes mellitus) 04/05/2020  . History of substance use disorder 04/05/2020  . Normal postpartum course 04/05/2020  . SVD (spontaneous vaginal delivery) 04/04/2020  . Gestational hypertension 04/03/2020  . Supervision of high risk pregnancy, antepartum 10/24/2019    Hospital course:  Induction of Labor With Vaginal Delivery   44 y.o. yo [id number] at [redacted]w[redacted]d was admitted to the hospital 04/03/2020 for induction of labor.  Indication for induction: Gestational hypertension.  Patient had an uncomplicated labor course as follows: Membrane Rupture  Time/Date: 12:04 PM ,04/04/2020   Intrapartum Procedures: Episiotomy: None [1]                                         Lacerations:  None [1]  Patient had delivery of a Viable infant.  Information for the patient's newborn:  Nykira, Reddix [[id number]]      04/04/2020  Details of delivery can be found in separate delivery note.  Patient had a routine postpartum course. Patient is discharged home 04/05/20. Postpartum Day # 2 : S/P NSVD due to GHT, currently denies HA, RUQ pain, no vision changes. Yolanda Holmes stable, BP currently 136/85. Patient up ad lib, denies syncope or dizziness. Reports consuming regular diet without issues and denies N/V. Patient reports 0 bowel movement + passing flatus.  Denies issues with urination and reports bleeding is "lighter."  Patient is breast feeding and reports going well.  Desires tubal at 6 weeks PP for postpartum contraception.  Pain is being appropriately managed with use of po meds. Yolanda Holmes had GDM during this pregnancy, cbg wnl, will f/u out Yolanda Holmes. H/O opioid use disorder 7 years ago, Yolanda Holmes USDS was negative, Yolanda Holmes ednroses mood being  stable, denies depression, SI/HI, f/u at [facility] for mood screening.    Physical exam  Vitals:   04/04/20 1803 04/04/20 2248 04/05/20 0329 04/05/20 0800  BP: 132/80 133/87 128/78 136/85  Pulse: 98 91 90 87  Resp: 16 18 16 17   Temp: 98 F (36.7 C) 98 F (36.7 C) 98.2 F (36.8 C) 98.2 F (36.8 C)  TempSrc: Oral Oral Oral Oral  SpO2:  100% 100% 100%  Weight:      Height:       General: alert, cooperative and no distress Lochia: appropriate Uterine Fundus: firm Perineum: Intact DVT Evaluation: No evidence of DVT seen on physical exam. Negative Homan's sign. No cords or calf tenderness. No significant calf/ankle edema.  Labs: Lab Results  Component Value Date   WBC 13.7 (H) 04/05/2020   HGB 10.7 (L) 04/05/2020   HCT 32.7 (L) 04/05/2020   MCV 93.2 04/05/2020   PLT 364 04/05/2020   CMP Latest Ref Rng & Units 04/03/2020  Glucose 70 -  99 mg/dL 89  BUN 6 - 20 mg/dL 8  Creatinine 04/05/2020 - 0.86 mg/dL 7.61  Sodium 9.50 - 932 mmol/L 136  Potassium 3.5 - 5.1 mmol/L 3.7  Chloride 98 - 111 mmol/L 105  CO2 22 - 32 mmol/L 20(L)  Calcium 8.9 - 10.3 mg/dL 8.9  Total Protein 6.5 - 8.1 g/dL 5.9(L)  Total Bilirubin 0.3 - 1.2 mg/dL 671)  Alkaline Phos 38 - 126 U/L 140(H)  AST 15 - 41 U/L 17  ALT 0 - 44 U/L 17    Date of discharge: 04/05/2020 Discharge Diagnoses: Term Pregnancy-delivered Discharge instruction: per After Visit Summary and "Baby and Me Booklet".  After visit meds:   Activity:           unrestricted and pelvic rest Advance as tolerated. Pelvic rest for 6 weeks.  Diet:                routine Medications: PNV and Ibuprofen Postpartum contraception: Tubal Ligation 6 weeks PP Condition:  Yolanda Holmes discharge to home with baby in stable GHTN: report s/sx of HA, RUQ pain or vision changes, BP check in [facility] in one week.  GDM: 2H GTT at 6 weeks PPV.  H/O Opioid use disorder: mood check in one week.   Meds: Allergies as of 04/05/2020   No Known Allergies     Medication List    STOP taking these medications   aspirin 325 MG tablet   benzonatate 100 MG capsule Commonly known as: TESSALON   ciprofloxacin 0.3 % ophthalmic ointment Commonly known as: CILOXAN   naproxen 500 MG tablet Commonly known as: NAPROSYN   omeprazole 20 MG capsule Commonly known as: PRILOSEC   predniSONE 50 MG tablet Commonly known as: DELTASONE     TAKE these medications   albuterol 108 (90 Base) MCG/ACT inhaler Commonly known as: VENTOLIN HFA Inhale 2 puffs into the lungs every 4 (four) hours as needed for wheezing or shortness of breath.   ibuprofen 800 MG tablet Commonly known as: ADVIL Take 1 tablet (800 mg total) by mouth every 8 (eight) hours as needed.   prenatal multivitamin Tabs tablet Take 1 tablet by mouth daily at 12 noon.       Discharge Follow Up:  Follow-up Information    [facility]  Follow up.   Specialty: Obstetrics and Gynecology Why: 1 week BP/Mood check 6 weeks PP with 2HGTT for checking overt DM.  Contact information: [address]  [place] [id number] [place], NP-C, [place] 04/05/2020, 1:44 PM  Noralyn Pick, [place]

## 2020-04-07 ENCOUNTER — Encounter (HOSPITAL_COMMUNITY): Payer: Self-pay | Admitting: Obstetrics & Gynecology

## 2020-04-08 ENCOUNTER — Other Ambulatory Visit (HOSPITAL_COMMUNITY): Payer: Medicaid Other

## 2020-04-10 ENCOUNTER — Inpatient Hospital Stay (HOSPITAL_COMMUNITY)
Admission: AD | Admit: 2020-04-10 | Payer: Medicaid Other | Source: Home / Self Care | Admitting: Obstetrics & Gynecology

## 2020-04-10 ENCOUNTER — Inpatient Hospital Stay (HOSPITAL_COMMUNITY): Payer: Medicaid Other

## 2020-12-26 ENCOUNTER — Emergency Department (HOSPITAL_COMMUNITY)
Admission: EM | Admit: 2020-12-26 | Discharge: 2020-12-26 | Disposition: A | Discharge status: Home / Self Care | Payer: Managed Care, Other (non HMO) | Attending: Emergency Medicine | Admitting: Emergency Medicine

## 2020-12-26 ENCOUNTER — Other Ambulatory Visit: Payer: Self-pay

## 2020-12-26 ENCOUNTER — Emergency Department (HOSPITAL_COMMUNITY): Payer: Managed Care, Other (non HMO)

## 2020-12-26 ENCOUNTER — Encounter (HOSPITAL_COMMUNITY): Payer: Self-pay | Admitting: *Deleted

## 2020-12-26 DIAGNOSIS — R110 Nausea: Secondary | ICD-10-CM | POA: Diagnosis not present

## 2020-12-26 DIAGNOSIS — Z5321 Procedure and treatment not carried out due to patient leaving prior to being seen by health care provider: Secondary | ICD-10-CM | POA: Insufficient documentation

## 2020-12-26 DIAGNOSIS — R519 Headache, unspecified: Secondary | ICD-10-CM | POA: Insufficient documentation

## 2020-12-26 DIAGNOSIS — R002 Palpitations: Secondary | ICD-10-CM | POA: Diagnosis not present

## 2020-12-26 DIAGNOSIS — W01198A Fall on same level from slipping, tripping and stumbling with subsequent striking against other object, initial encounter: Secondary | ICD-10-CM | POA: Insufficient documentation

## 2020-12-26 LAB — BASIC METABOLIC PANEL
Anion gap: 12 (ref 5–15)
BUN: 14 mg/dL (ref 6–20)
CO2: 22 mmol/L (ref 22–32)
Calcium: 9.7 mg/dL (ref 8.9–10.3)
Chloride: 105 mmol/L (ref 98–111)
Creatinine, Ser: 0.95 mg/dL (ref 0.44–1.00)
GFR, Estimated: >60 mL/min (ref >60)
Glucose, Bld: 102 mg/dL — ABNORMAL HIGH (ref 70–99)
Potassium: 3.9 mmol/L (ref 3.5–5.1)
Sodium: 139 mmol/L (ref 135–145)

## 2020-12-26 LAB — CBC
HCT: 41.9 % (ref 36.0–46.0)
Hemoglobin: 14.5 g/dL (ref 12.0–15.0)
MCH: 32.1 pg (ref 26.0–34.0)
MCHC: 34.6 g/dL (ref 30.0–36.0)
MCV: 92.7 fL (ref 80.0–100.0)
Platelets: 404 10*3/uL — ABNORMAL HIGH (ref 150–400)
RBC: 4.52 MIL/uL (ref 3.87–5.11)
RDW: 12.4 % (ref 11.5–15.5)
WBC: 12.7 10*3/uL — ABNORMAL HIGH (ref 4.0–10.5)
nRBC: 0 % (ref 0.0–0.2)

## 2020-12-26 LAB — I-STAT BETA HCG BLOOD, ED (MC, WL, AP ONLY): I-stat hCG, quantitative: <5 m[IU]/mL (ref <5)

## 2020-12-26 LAB — TROPONIN I (HIGH SENSITIVITY): Troponin I (High Sensitivity): 3 ng/L (ref <18)

## 2020-12-26 NOTE — ED Notes (Signed)
Pt checked out AMA. 

## 2020-12-26 NOTE — ED Triage Notes (Addendum)
Pt here POV. She was sitting and eating and she didn't feel very well.  Hx of svt and pt states she felt as if she was in SVT. She stood up to try to get her bearings and the next thing she realized she was laying on the ground.  Pt hit the back of her head when she fell and she is c/o R frontal headache and nausea presently.  Also c/o palpitations and chest discomfort.

## 2022-10-15 IMAGING — CT CT HEAD W/O CM
4 series · 16 of 47 positions shown, 18 images · non-contrast
Comparison: CT max face 07/29/2010, CT head 08/08/2008

CLINICAL DATA: Headache, chronic.  Single episodes today at work.

EXAM:
CT HEAD WITHOUT CONTRAST
TECHNIQUE: Contiguous axial images were obtained from the base of the skull
through the vertex without intravenous contrast.

[Series 3: head wo · axial · 0.44mm/px · z∈[+1593,+1718]mm · 7 of 35 slices shown, 9 images]
[im 5/35  brain]
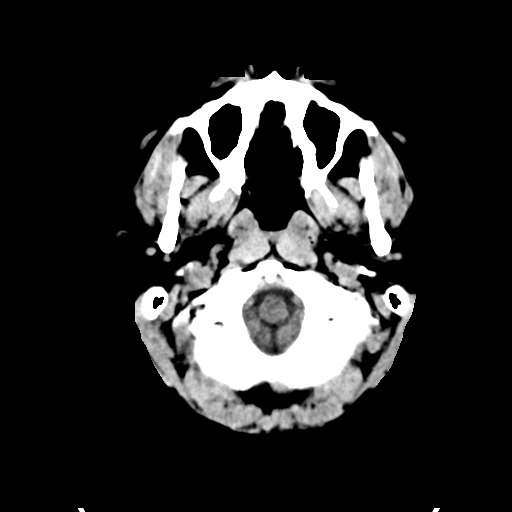
[im 5/35  bone]
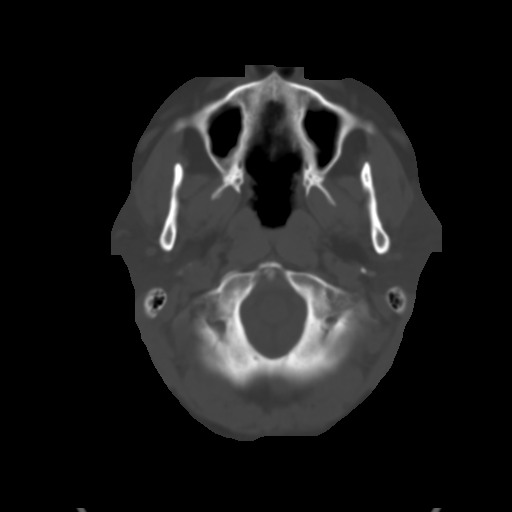
[im 9/35  brain]
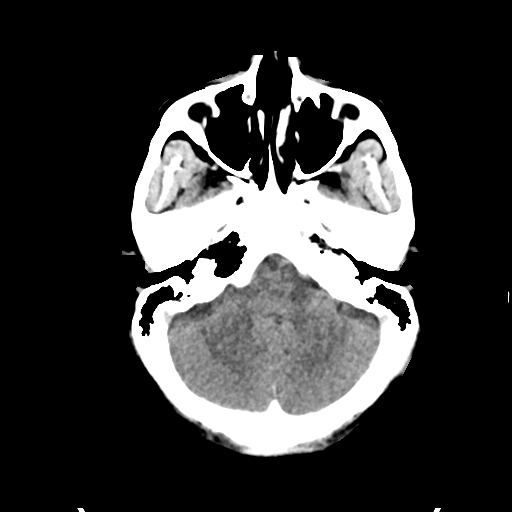
[im 13/35  brain]
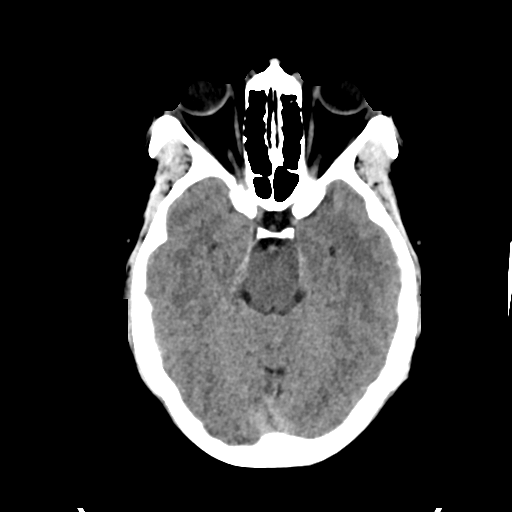
[im 18/35  brain]
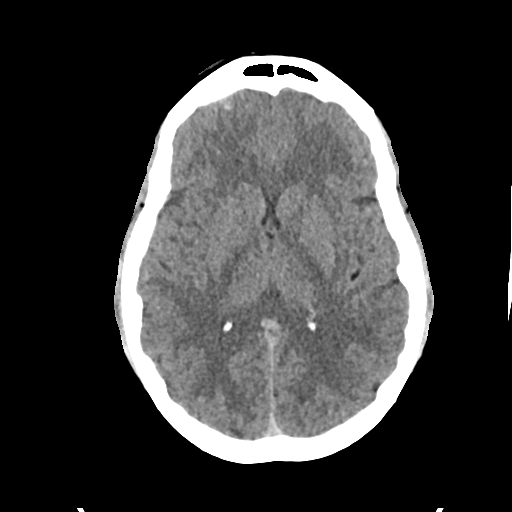
[im 22/35  brain]
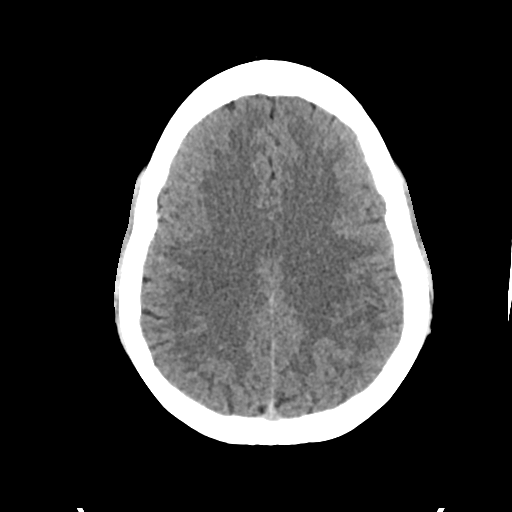
[im 22/35  bone]
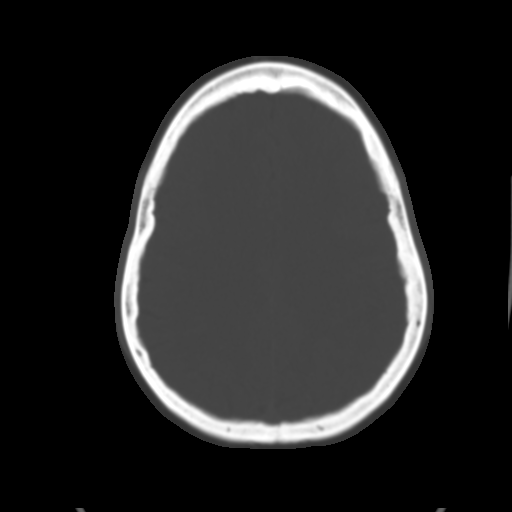
[im 26/35  brain]
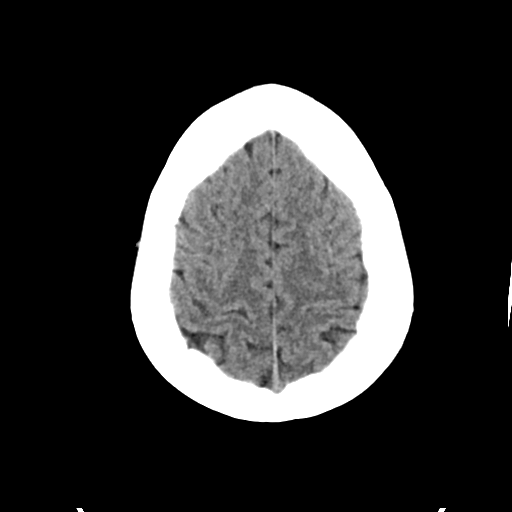
[im 30/35  brain]
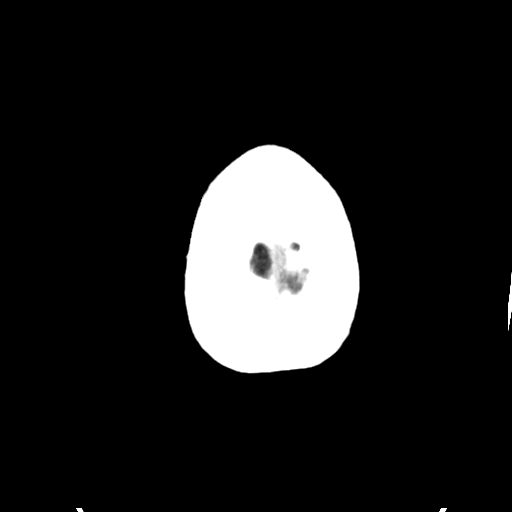

[Series 4: head bone · axial · 0.44mm/px · z∈[+1589,+1623]mm · 3 of 86 slices shown]
[im 9/86  bone]
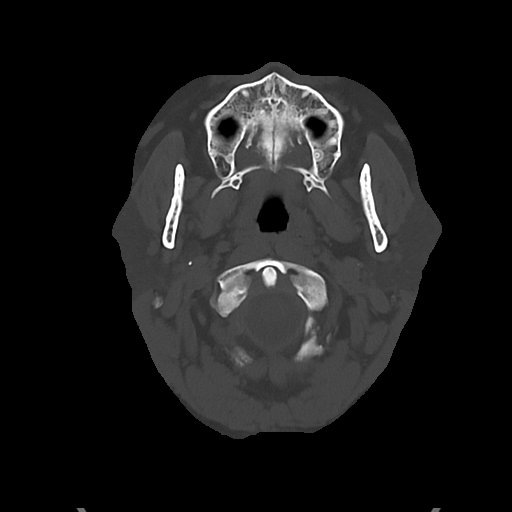
[im 18/86  bone]
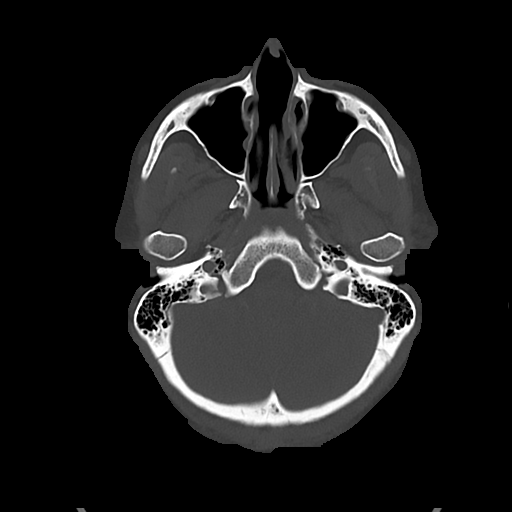
[im 26/86  bone]
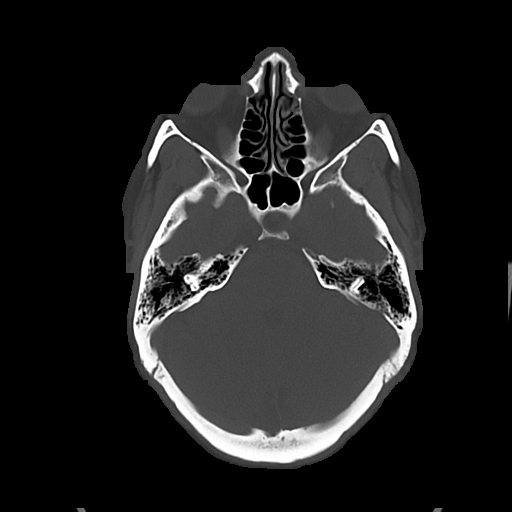

[Series 5: cor soft · coronal · 0.31mm/px · 3 of 72 slices shown]
[im 24/72  brain]
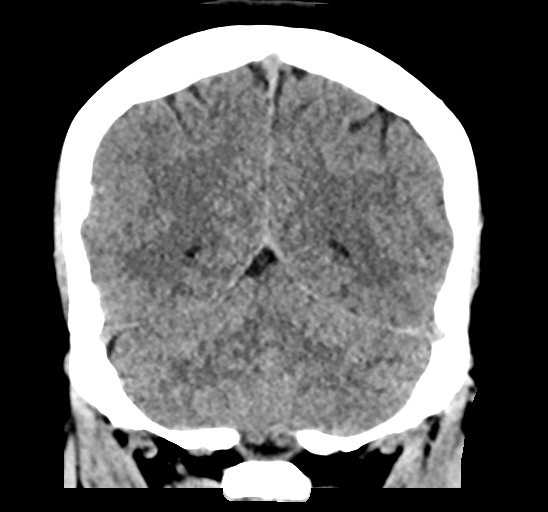
[im 32/72  brain]
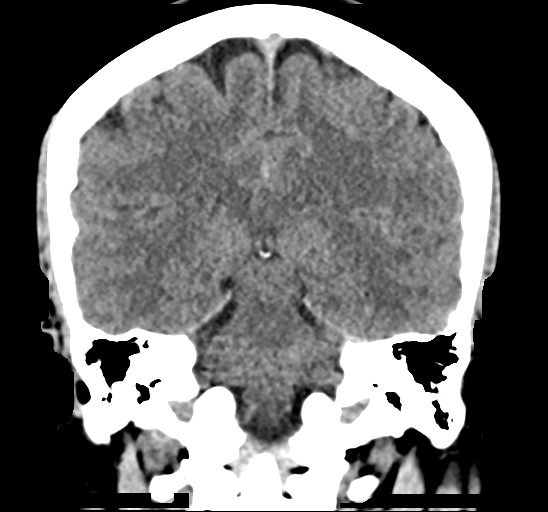
[im 40/72  brain]
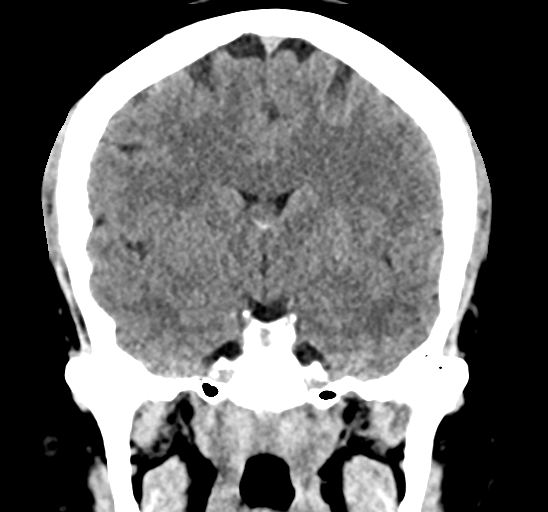

[Series 6: sag soft · sagittal · 0.31mm/px · 3 of 55 slices shown]
[im 19/55  brain]
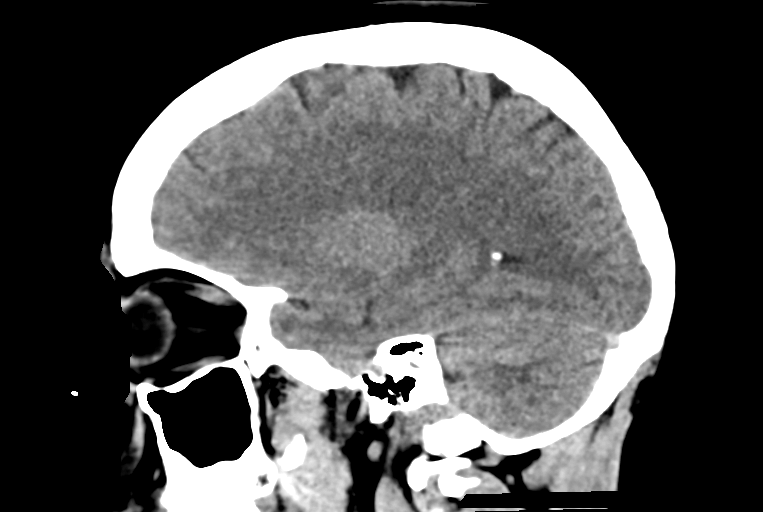
[im 28/55  brain]
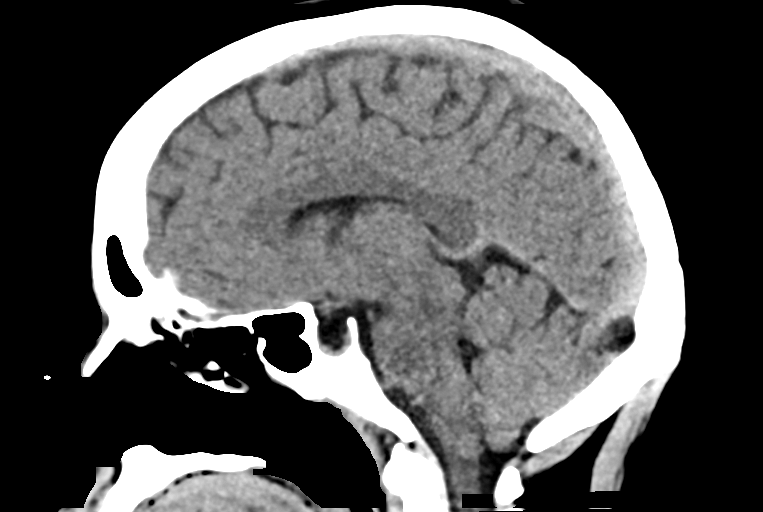
[im 37/55  brain]
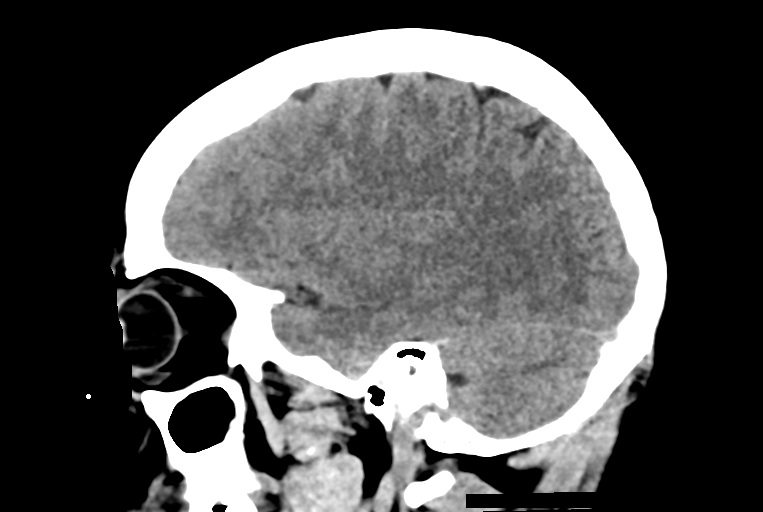

[16 of 47 positions shown; findings below may reference images not displayed]

FINDINGS: Brain:

No evidence of large-territorial acute infarction. No parenchymal
hemorrhage. No mass lesion. No extra-axial collection.

No mass effect or midline shift. No hydrocephalus. Basilar cisterns
are patent.

Vascular: No hyperdense vessel.

Skull: No acute fracture or focal lesion.

Sinuses/Orbits: Paranasal sinuses and mastoid air cells are clear.
The orbits are unremarkable.

Other: None.
IMPRESSION: No acute intracranial abnormality.

## 2023-01-15 ENCOUNTER — Other Ambulatory Visit: Payer: Self-pay

## 2023-01-15 ENCOUNTER — Emergency Department (HOSPITAL_COMMUNITY)
Admission: EM | Admit: 2023-01-15 | Discharge: 2023-01-16 | Discharge status: Against Medical Advice | Payer: No Typology Code available for payment source | Attending: Physician Assistant | Admitting: Physician Assistant

## 2023-01-15 ENCOUNTER — Emergency Department (HOSPITAL_COMMUNITY): Payer: No Typology Code available for payment source

## 2023-01-15 DIAGNOSIS — Z5321 Procedure and treatment not carried out due to patient leaving prior to being seen by health care provider: Secondary | ICD-10-CM | POA: Diagnosis not present

## 2023-01-15 DIAGNOSIS — R112 Nausea with vomiting, unspecified: Secondary | ICD-10-CM | POA: Insufficient documentation

## 2023-01-15 DIAGNOSIS — R079 Chest pain, unspecified: Secondary | ICD-10-CM | POA: Diagnosis present

## 2023-01-15 LAB — COMPREHENSIVE METABOLIC PANEL
ALT: 27 U/L (ref 0–44)
AST: 20 U/L (ref 15–41)
Albumin: 3.6 g/dL (ref 3.5–5.0)
Alkaline Phosphatase: 62 U/L (ref 38–126)
Anion gap: 12 (ref 5–15)
BUN: 13 mg/dL (ref 6–20)
CO2: 22 mmol/L (ref 22–32)
Calcium: 8.9 mg/dL (ref 8.9–10.3)
Chloride: 104 mmol/L (ref 98–111)
Creatinine, Ser: 0.93 mg/dL (ref 0.44–1.00)
GFR, Estimated: >60 mL/min (ref >60)
Glucose, Bld: 100 mg/dL — ABNORMAL HIGH (ref 70–99)
Potassium: 3.5 mmol/L (ref 3.5–5.1)
Sodium: 138 mmol/L (ref 135–145)
Total Bilirubin: 0.4 mg/dL (ref 0.3–1.2)
Total Protein: 6 g/dL — ABNORMAL LOW (ref 6.5–8.1)

## 2023-01-15 LAB — CBC WITH DIFFERENTIAL/PLATELET
Abs Immature Granulocytes: 0.02 10*3/uL (ref 0.00–0.07)
Basophils Absolute: 0.1 10*3/uL (ref 0.0–0.1)
Basophils Relative: 1 %
Eosinophils Absolute: 0.2 10*3/uL (ref 0.0–0.5)
Eosinophils Relative: 2 %
HCT: 40.2 % (ref 36.0–46.0)
Hemoglobin: 13.7 g/dL (ref 12.0–15.0)
Immature Granulocytes: 0 %
Lymphocytes Relative: 40 %
Lymphs Abs: 3.7 10*3/uL (ref 0.7–4.0)
MCH: 31.9 pg (ref 26.0–34.0)
MCHC: 34.1 g/dL (ref 30.0–36.0)
MCV: 93.5 fL (ref 80.0–100.0)
Monocytes Absolute: 0.8 10*3/uL (ref 0.1–1.0)
Monocytes Relative: 9 %
Neutro Abs: 4.6 10*3/uL (ref 1.7–7.7)
Neutrophils Relative %: 48 %
Platelets: 347 10*3/uL (ref 150–400)
RBC: 4.3 MIL/uL (ref 3.87–5.11)
RDW: 12 % (ref 11.5–15.5)
WBC: 9.5 10*3/uL (ref 4.0–10.5)
nRBC: 0 % (ref 0.0–0.2)

## 2023-01-15 LAB — TROPONIN I (HIGH SENSITIVITY): Troponin I (High Sensitivity): 4 ng/L (ref <18)

## 2023-01-15 LAB — LIPASE, BLOOD: Lipase: 35 U/L (ref 11–51)

## 2023-01-15 NOTE — ED Provider Triage Note (Signed)
Emergency Medicine Provider Triage Evaluation Note  Yolanda Holmes , a 47 y.o. female  was evaluated in triage.  Pt complains of chest pain that radiated to her jaw.  Pt reports nausea and vomiting   Review of Systems  Positive:  Negative: fever  Physical Exam  BP (!) 166/108   Pulse 74   Temp 98.5 F (36.9 C) (Oral)   Resp 12   SpO2 96%  Gen:   Awake, no distress   Resp:  Normal effort  MSK:   Moves extremities without difficulty  Other:    Medical Decision Making  Medically screening exam initiated at 7:53 PM.  Appropriate orders placed.  Yolanda Holmes was informed that the remainder of the evaluation will be completed by another provider, this initial triage assessment does not replace that evaluation, and the importance of remaining in the ED until their evaluation is complete.     Fransico Meadow, [place] 01/15/23 1955

## 2023-01-15 NOTE — ED Triage Notes (Addendum)
Patient arrived with EMS from home reports central chest pain radiating to left arm and left jaw with SOB and emesis this evening , she received ASA 324 mg , 1 NTG sl , and Zofran 4 mg IV by EMS prior to arrival .

## 2023-01-15 NOTE — ED Notes (Signed)
Pt called 2x no answer 

## 2023-01-18 NOTE — Progress Notes (Signed)
HPI: Ms.Vondell Vonnie Spagnolo is a 47 y.o. female with past medical history significant for gestational hypertensionHTN, and gestational diabetes here today to establish care.  Former PCP: N/A Last gyn preventive visit 07/22/21.  Today she is c/o of lower back pain, more pronounced on the right side, radiating down to her right leg. She reports experiencing numbness and tingling in her right leg since giving birth in 2021. The pain intensity is rated as a 6 out of 10. She has been taking ibuprofen, approximately four times a day, for pain management. Negative for saddle anesthesia or bowel/bladder dysfunction. She has completed PT and did not help.  Hypertension: She is currently on Losartan 100 mg daily and Metoprolol succinate 100 mg daily. She follows with cardiologist, Dr. Terrence Dupont, appt today and following every 3 months. Negative for unusual or severe headache, visual changes, exertional chest pain, dyspnea,  focal weakness, or edema.  Lab Results  Component Value Date   CREATININE 0.93 01/15/2023   BUN 13 01/15/2023   NA 138 01/15/2023   K 3.5 01/15/2023   CL 104 01/15/2023   CO2 22 01/15/2023   Lab Results  Component Value Date   WBC 9.5 01/15/2023   HGB 13.7 01/15/2023   HCT 40.2 01/15/2023   MCV 93.5 01/15/2023   PLT 347 01/15/2023   She does not exercise regularly, trying to follow a healthful diet.  Nicotine patches helped with smoking during pregnancy, would like to try again. Smokes 7 cig/day.  Review of Systems  Constitutional:  Negative for chills and fever.  Respiratory:  Negative for cough and wheezing.   Gastrointestinal:  Negative for abdominal pain, nausea and vomiting.  Endocrine: Negative for cold intolerance and heat intolerance.  Genitourinary:  Negative for decreased urine volume, dysuria and hematuria.  Skin:  Negative for rash.  Neurological:  Negative for syncope and facial asymmetry.  See other pertinent positives and negatives in  HPI.  Current Outpatient Medications on File Prior to Visit  Medication Sig Dispense Refill   losartan (COZAAR) 100 MG tablet Take 100 mg by mouth daily.     metoprolol succinate (TOPROL-XL) 100 MG 24 hr tablet Take 100 mg by mouth daily. Take with or immediately following a meal.     Multiple Vitamin (MULTIVITAMIN) tablet Take 1 tablet by mouth daily.     No current facility-administered medications on file prior to visit.   Past Medical History:  Diagnosis Date   Gestational diabetes    Opiate addiction ([place])    Single vessel of umbilical cord    SVT (supraventricular tachycardia)    resolved since discontinuing opiate abuse   No Known Allergies  Family History  Problem Relation Age of Onset   Hypertension Mother    Diabetes Mother    Social History   Socioeconomic History   Marital status: Soil scientist    Spouse name: Not on file   Number of children: Not on file   Years of education: Not on file   Highest education level: Not on file  Occupational History   Not on file  Tobacco Use   Smoking status: Every Day    Packs/day: 1.00    Types: Cigarettes   Smokeless tobacco: Never  Vaping Use   Vaping Use: Never used  Substance and Sexual Activity   Alcohol use: No   Drug use: No    Comment: former opiate abuser   Sexual activity: Not on file  Other Topics Concern   Not on file  Social History Narrative   Not on file   Social Determinants of Health   Financial Resource Strain: Not on file  Food Insecurity: Not on file  Transportation Needs: Not on file  Physical Activity: Not on file  Stress: Not on file  Social Connections: Not on file   Vitals:   01/19/23 1403  BP: 130/80  Pulse: 76  Resp: 12  Temp: 98.5 F (36.9 C)  SpO2: 98%   Body mass index is 41.18 kg/m.  Physical Exam Vitals and nursing note reviewed.  Constitutional:      General: She is not in acute distress.    Appearance: She is well-developed.  HENT:     Head: Normocephalic  and atraumatic.     Mouth/Throat:     Mouth: Mucous membranes are moist.     Pharynx: Oropharynx is clear.  Eyes:     Conjunctiva/sclera: Conjunctivae normal.  Cardiovascular:     Rate and Rhythm: Normal rate and regular rhythm.     Pulses:          Dorsalis pedis pulses are 2+ on the right side and 2+ on the left side.     Heart sounds: No murmur heard. Pulmonary:     Effort: Pulmonary effort is normal. No respiratory distress.     Breath sounds: Normal breath sounds.  Abdominal:     Palpations: Abdomen is soft. There is no hepatomegaly or mass.     Tenderness: There is no abdominal tenderness.  Musculoskeletal:     Lumbar back: No tenderness or bony tenderness. Negative right straight leg raise test and negative left straight leg raise test.  Lymphadenopathy:     Cervical: No cervical adenopathy.  Skin:    General: Skin is warm.     Findings: No erythema or rash.  Neurological:     General: No focal deficit present.     Mental Status: She is alert and oriented to person, place, and time.     Cranial Nerves: No cranial nerve deficit.     Gait: Gait normal.  Psychiatric:        Mood and Affect: Mood and affect normal.   ASSESSMENT AND PLAN: Milton was seen today for establish care.  Diagnoses and all orders for this visit: Lab Results  Component Value Date   HGBA1C 5.5 01/19/2023   Lumbar back pain with radiculopathy affecting right lower extremity Assessment & Plan: Symptomatic for 2 years. We discussed treatment options, she agrees with trying Duloxetine 30 mg daily, some some effects discussed. Lumbar MRI will be arranged. F/U in 2 months.  Orders: -     DULoxetine HCl; Take 1 capsule (30 mg total) by mouth daily.  Dispense: 30 capsule; Refill: 1 -     MR LUMBAR SPINE WO CONTRAST; Future  Hx of gestational diabetes mellitus, not currently pregnant -     POCT glycosylated hemoglobin (Hb A1C)  Colon cancer screening -     Ambulatory referral to  Gastroenterology  Tobacco use disorder Assessment & Plan: Motivated to quit. Nicotine patches have helped in the past, Rx sent.  Orders: -     Nicotine; Place 1 patch (14 mg total) onto the skin daily.  Dispense: 28 patch; Refill: 0  Essential (primary) hypertension Assessment & Plan: BP adequately controlled. Continue Losartan 100 mg daily and Metoprolol succinate 100 mg daily. DASH/low salt diet to continue. Monitor BP at home. Eye exam is current.   Need for influenza vaccination -     Flu  Vaccine QUAD 17mo+IM (Fluarix, Fluzone & Alfiuria Quad PF)  Return in about 2 months (around 03/20/2023).  Berlynn Warsame G. Martinique, MD  [facility]. [place] office.

## 2023-01-19 ENCOUNTER — Encounter: Payer: Self-pay | Admitting: Family Medicine

## 2023-01-19 ENCOUNTER — Ambulatory Visit: Payer: No Typology Code available for payment source | Admitting: Family Medicine

## 2023-01-19 VITALS — BP 130/80 | HR 76 | Temp 98.5°F | Resp 12 | Ht 62.0 in | Wt 225.1 lb

## 2023-01-19 DIAGNOSIS — M5416 Radiculopathy, lumbar region: Secondary | ICD-10-CM

## 2023-01-19 DIAGNOSIS — F17200 Nicotine dependence, unspecified, uncomplicated: Secondary | ICD-10-CM

## 2023-01-19 DIAGNOSIS — Z8632 Personal history of gestational diabetes: Secondary | ICD-10-CM

## 2023-01-19 DIAGNOSIS — Z1211 Encounter for screening for malignant neoplasm of colon: Secondary | ICD-10-CM | POA: Diagnosis not present

## 2023-01-19 DIAGNOSIS — I10 Essential (primary) hypertension: Secondary | ICD-10-CM

## 2023-01-19 DIAGNOSIS — Z23 Encounter for immunization: Secondary | ICD-10-CM

## 2023-01-19 LAB — POCT GLYCOSYLATED HEMOGLOBIN (HGB A1C): Hemoglobin A1C: 5.5 % (ref 4.0–5.6)

## 2023-01-19 MED ORDER — DULOXETINE HCL 30 MG PO CPEP: 30.0000 mg | ORAL_CAPSULE | Freq: Every day | ORAL | 1 refills | Status: DC

## 2023-01-19 MED ORDER — NICOTINE 14 MG/24HR TD PT24: 14.0000 mg | MEDICATED_PATCH | Freq: Every day | TRANSDERMAL | 0 refills | Status: DC

## 2023-01-19 NOTE — Patient Instructions (Addendum)
A few things to remember from today's visit:  Lumbar back pain with radiculopathy affecting right lower extremity - Plan: DULoxetine (CYMBALTA) 30 MG capsule, MR Lumbar Spine Wo Contrast  Hx of gestational diabetes mellitus, not currently pregnant  Colon cancer screening - Plan: Ambulatory referral to Gastroenterology  Tobacco use disorder - Plan: nicotine (NICODERM CQ - DOSED IN MG/24 HOURS) 14 mg/24hr patch  Essential (primary) hypertension - Plan: losartan (COZAAR) 100 MG tablet, metoprolol succinate (TOPROL-XL) 100 MG 24 hr tablet  Duloxetine 30 mg to take daily for back pain. Lumbar MRI is being arranged.  If you need refills for medications you take chronically, please call your pharmacy. Do not use My Chart to request refills or for acute issues that need immediate attention. If you send a my chart message, it may take a few days to be addressed, specially if I am not in the office.  Please be sure medication list is accurate. If a new problem present, please set up appointment sooner than planned today.

## 2023-01-22 NOTE — Assessment & Plan Note (Signed)
Symptomatic for 2 years. We discussed treatment options, she agrees with trying Duloxetine 30 mg daily, some some effects discussed. Lumbar MRI will be arranged. F/U in 2 months.

## 2023-01-22 NOTE — Assessment & Plan Note (Signed)
Motivated to quit. Nicotine patches have helped in the past, Rx sent.

## 2023-01-22 NOTE — Assessment & Plan Note (Signed)
BP adequately controlled. Continue Losartan 100 mg daily and Metoprolol succinate 100 mg daily. DASH/low salt diet to continue. Monitor BP at home. Eye exam is current.

## 2023-01-27 ENCOUNTER — Ambulatory Visit (HOSPITAL_BASED_OUTPATIENT_CLINIC_OR_DEPARTMENT_OTHER)
Admission: RE | Admit: 2023-01-27 | Discharge: 2023-01-27 | Disposition: A | Discharge status: Home / Self Care | Payer: No Typology Code available for payment source | Source: Ambulatory Visit | Attending: Family Medicine | Admitting: Family Medicine

## 2023-01-27 DIAGNOSIS — M5416 Radiculopathy, lumbar region: Secondary | ICD-10-CM

## 2023-01-27 DIAGNOSIS — M4726 Other spondylosis with radiculopathy, lumbar region: Secondary | ICD-10-CM | POA: Diagnosis not present

## 2023-01-28 ENCOUNTER — Other Ambulatory Visit: Payer: Self-pay

## 2023-01-28 DIAGNOSIS — F17200 Nicotine dependence, unspecified, uncomplicated: Secondary | ICD-10-CM

## 2023-01-28 DIAGNOSIS — M5416 Radiculopathy, lumbar region: Secondary | ICD-10-CM

## 2023-01-28 MED ORDER — NICOTINE 14 MG/24HR TD PT24: 14.0000 mg | MEDICATED_PATCH | Freq: Every day | TRANSDERMAL | 0 refills | Status: AC

## 2023-01-28 MED ORDER — DULOXETINE HCL 30 MG PO CPEP: 30.0000 mg | ORAL_CAPSULE | Freq: Every day | ORAL | 1 refills | Status: DC

## 2023-03-21 NOTE — Progress Notes (Deleted)
     HPI: Ms.Yolanda Holmes is a 47 y.o. female, who is here today for chronic disease management.  Last seen on 01/19/23  *** Review of Systems See other pertinent positives and negatives in HPI.  Current Outpatient Medications on File Prior to Visit  Medication Sig Dispense Refill   DULoxetine (CYMBALTA) 30 MG capsule Take 1 capsule (30 mg total) by mouth daily. 90 capsule 1   losartan (COZAAR) 100 MG tablet Take 100 mg by mouth daily.     metoprolol succinate (TOPROL-XL) 100 MG 24 hr tablet Take 100 mg by mouth daily. Take with or immediately following a meal.     Multiple Vitamin (MULTIVITAMIN) tablet Take 1 tablet by mouth daily.     nicotine (NICODERM CQ - DOSED IN MG/24 HOURS) 14 mg/24hr patch Place 1 patch (14 mg total) onto the skin daily. 28 patch 0   No current facility-administered medications on file prior to visit.    Past Medical History:  Diagnosis Date   Gestational diabetes    Opiate addiction ([place])    Single vessel of umbilical cord    SVT (supraventricular tachycardia)    resolved since discontinuing opiate abuse   No Known Allergies  Social History   Socioeconomic History   Marital status: Soil scientist    Spouse name: Not on file   Number of children: Not on file   Years of education: Not on file   Highest education level: Not on file  Occupational History   Not on file  Tobacco Use   Smoking status: Every Day    Packs/day: 1    Types: Cigarettes   Smokeless tobacco: Never  Vaping Use   Vaping Use: Never used  Substance and Sexual Activity   Alcohol use: No   Drug use: No    Comment: former opiate abuser   Sexual activity: Not on file  Other Topics Concern   Not on file  Social History Narrative   Not on file   Social Determinants of Health   Financial Resource Strain: Not on file  Food Insecurity: Not on file  Transportation Needs: Not on file  Physical Activity: Not on file  Stress: Not on file  Social Connections:  Not on file    There were no vitals filed for this visit. There is no height or weight on file to calculate BMI.  Physical Exam  ASSESSMENT AND PLAN:  There are no diagnoses linked to this encounter.  No orders of the defined types were placed in this encounter.   No problem-specific Assessment & Plan notes found for this encounter.   No follow-ups on file.  Betty G. Martinique, MD  [facility]. [place] office.

## 2023-03-22 ENCOUNTER — Ambulatory Visit: Payer: No Typology Code available for payment source | Admitting: Family Medicine

## 2023-05-13 ENCOUNTER — Other Ambulatory Visit: Payer: Self-pay

## 2023-05-13 ENCOUNTER — Emergency Department (HOSPITAL_COMMUNITY): Payer: No Typology Code available for payment source

## 2023-05-13 ENCOUNTER — Emergency Department (HOSPITAL_COMMUNITY)
Admission: EM | Admit: 2023-05-13 | Discharge: 2023-05-13 | Disposition: A | Discharge status: Home / Self Care | Payer: No Typology Code available for payment source | Attending: Emergency Medicine | Admitting: Emergency Medicine

## 2023-05-13 DIAGNOSIS — Z79899 Other long term (current) drug therapy: Secondary | ICD-10-CM | POA: Insufficient documentation

## 2023-05-13 DIAGNOSIS — I10 Essential (primary) hypertension: Secondary | ICD-10-CM | POA: Insufficient documentation

## 2023-05-13 DIAGNOSIS — E876 Hypokalemia: Secondary | ICD-10-CM

## 2023-05-13 DIAGNOSIS — I4891 Unspecified atrial fibrillation: Secondary | ICD-10-CM | POA: Insufficient documentation

## 2023-05-13 DIAGNOSIS — R0789 Other chest pain: Secondary | ICD-10-CM | POA: Diagnosis present

## 2023-05-13 LAB — CBC
HCT: 48.1 % — ABNORMAL HIGH (ref 36.0–46.0)
Hemoglobin: 16.2 g/dL — ABNORMAL HIGH (ref 12.0–15.0)
MCH: 31 pg (ref 26.0–34.0)
MCHC: 33.7 g/dL (ref 30.0–36.0)
MCV: 92 fL (ref 80.0–100.0)
Platelets: 377 10*3/uL (ref 150–400)
RBC: 5.23 MIL/uL — ABNORMAL HIGH (ref 3.87–5.11)
RDW: 11.8 % (ref 11.5–15.5)
WBC: 12.6 10*3/uL — ABNORMAL HIGH (ref 4.0–10.5)
nRBC: 0 % (ref 0.0–0.2)

## 2023-05-13 LAB — BASIC METABOLIC PANEL
Anion gap: 13 (ref 5–15)
BUN: 13 mg/dL (ref 6–20)
CO2: 21 mmol/L — ABNORMAL LOW (ref 22–32)
Calcium: 9.2 mg/dL (ref 8.9–10.3)
Chloride: 106 mmol/L (ref 98–111)
Creatinine, Ser: 0.8 mg/dL (ref 0.44–1.00)
GFR, Estimated: >60 mL/min (ref >60)
Glucose, Bld: 119 mg/dL — ABNORMAL HIGH (ref 70–99)
Potassium: 3.3 mmol/L — ABNORMAL LOW (ref 3.5–5.1)
Sodium: 140 mmol/L (ref 135–145)

## 2023-05-13 LAB — I-STAT BETA HCG BLOOD, ED (MC, WL, AP ONLY): I-stat hCG, quantitative: <5 m[IU]/mL (ref <5)

## 2023-05-13 LAB — MAGNESIUM: Magnesium: 2.1 mg/dL (ref 1.7–2.4)

## 2023-05-13 LAB — TROPONIN I (HIGH SENSITIVITY): Troponin I (High Sensitivity): 6 ng/L (ref <18)

## 2023-05-13 LAB — PREGNANCY, URINE: Preg Test, Ur: NEGATIVE

## 2023-05-13 MED ORDER — LACTATED RINGERS IV BOLUS
1000.0000 mL | Freq: Once | INTRAVENOUS | Status: AC
  Administered 2023-05-13: 1000 mL via INTRAVENOUS

## 2023-05-13 MED ORDER — POTASSIUM CHLORIDE CRYS ER 20 MEQ PO TBCR
40.0000 meq | EXTENDED_RELEASE_TABLET | Freq: Once | ORAL | Status: AC
  Administered 2023-05-13: 40 meq via ORAL
  Filled 2023-05-13: qty 2

## 2023-05-13 MED ORDER — KETOROLAC TROMETHAMINE 15 MG/ML IJ SOLN
15.0000 mg | Freq: Once | INTRAMUSCULAR | Status: AC
  Administered 2023-05-13: 15 mg via INTRAVENOUS
  Filled 2023-05-13: qty 1

## 2023-05-13 MED ORDER — ACETAMINOPHEN 500 MG PO TABS
1000.0000 mg | ORAL_TABLET | Freq: Once | ORAL | Status: AC
  Administered 2023-05-13: 1000 mg via ORAL
  Filled 2023-05-13: qty 2

## 2023-05-13 NOTE — ED Notes (Signed)
While provider was discussing treatment options with patient, she self converted into a sinus tachycardia.

## 2023-05-13 NOTE — Discharge Instructions (Addendum)
You were seen in the emergency department for your chest pain and heart palpitations.  You were in an abnormal rhythm called atrial fibrillation and likely you were able to convert back to normal rhythm without any intervention.  Your potassium level was slightly low which can increase your risk of A-fib and we did replete that for you in the emergency department.  You should make sure that you are eating potassium rich foods.  He can follow-up with your primary doctor to have your electrolytes rechecked and you can follow-up in the A-fib clinic for further workup for the cause of your A-fib today.  You should return to the emergency department if you are having recurrent episodes of your heart racing, you have significantly worsening chest pain, you pass out or if you have any other new or concerning symptoms.

## 2023-05-13 NOTE — ED Provider Notes (Signed)
[facility] EMERGENCY DE[facility]MENT AT [facility] Provider Note   CSN: [id number] Arrival date & time: 05/13/23  2051     History  Chief Complaint  Patient presents with   Chest Pain    Yolanda Holmes is a 47 y.o. female.  Patient is a 47 year old female with a past medical history of hypertension presenting to the emergency department with chest pain and heart palpitations.  She states around 30 minutes prior to arrival she started to feel some chest pressure and felt like her heart was racing.  She denies significant shortness of breath.  She states that she felt nauseous.  She states that she has had SVT in the past and tried to bear down and cough without any improvement and came to the emergency department.  She states that she does drink coffee regularly and occasionally drinks alcohol.  She denies any polysubstance use.  She denies any recent illnesses.  The history is provided by the patient.  Chest Pain      Home Medications Prior to Admission medications   Medication Sig Start Date End Date Taking? Authorizing Provider  DULoxetine (CYMBALTA) 30 MG capsule Take 1 capsule (30 mg total) by mouth daily. 01/28/23   [place], Betty G, MD  losartan (COZAAR) 100 MG tablet Take 100 mg by mouth daily.    [provider]  metoprolol succinate (TOPROL-XL) 100 MG 24 hr tablet Take 100 mg by mouth daily. Take with or immediately following a meal.    [provider]  Multiple Vitamin (MULTIVITAMIN) tablet Take 1 tablet by mouth daily.    [provider]  nicotine (NICODERM CQ - DOSED IN MG/24 HOURS) 14 mg/24hr patch Place 1 patch (14 mg total) onto the skin daily. 01/28/23   [place], Betty G, MD      Allergies    Patient has no known allergies.    Review of Systems   Review of Systems  Cardiovascular:  Positive for chest pain.    Physical Exam Updated Vital Signs BP (!) 136/94   Pulse 99   Temp 97.7 F (36.5 C)   Resp 18   Ht 5\' 2"  (1.575  m)   Wt 102.1 kg   SpO2 99%   BMI 41.18 kg/m  Physical Exam Vitals and nursing note reviewed.  Constitutional:      General: She is not in acute distress.    Appearance: She is well-developed. She is ill-appearing.  HENT:     Head: Normocephalic and atraumatic.  Eyes:     Extraocular Movements: Extraocular movements intact.  Cardiovascular:     Rate and Rhythm: Tachycardia present. Rhythm irregular.     Pulses:          Radial pulses are 2+ on the right side and 2+ on the left side.     Heart sounds: Normal heart sounds.  Pulmonary:     Effort: Pulmonary effort is normal.     Breath sounds: Normal breath sounds.  Abdominal:     Palpations: Abdomen is soft.     Tenderness: There is no abdominal tenderness.  Musculoskeletal:        General: Normal range of motion.     Cervical back: Normal range of motion and neck supple.  Skin:    General: Skin is warm and dry.  Neurological:     General: No focal deficit present.     Mental Status: She is alert and oriented to person, place, and time.  Psychiatric:  Mood and Affect: Mood normal.        Behavior: Behavior normal.     ED Results / Procedures / Treatments   Labs (all labs ordered are listed, but only abnormal results are displayed) Labs Reviewed  BASIC METABOLIC PANEL - Abnormal; Notable for the following components:      Result Value   Potassium 3.3 (*)    CO2 21 (*)    Glucose, Bld 119 (*)    All other components within normal limits  CBC - Abnormal; Notable for the following components:   WBC 12.6 (*)    RBC 5.23 (*)    Hemoglobin 16.2 (*)    HCT 48.1 (*)    All other components within normal limits  PREGNANCY, URINE  MAGNESIUM  I-STAT BETA HCG BLOOD, ED ([facility], [facility], [facility] ONLY)  TROPONIN I (HIGH SENSITIVITY)  TROPONIN I (HIGH SENSITIVITY)    EKG EKG Interpretation  Date/Time:  Friday May 13 2023 20:36:01 EDT Ventricular Rate:  149 PR Interval:    QRS Duration: 74 QT Interval:  296 QTC  Calculation: 466 R Axis:   56 Text Interpretation: Atrial fibrillation with rapid ventricular response Nonspecific ST abnormality Abnormal ECG  New onset a fib compared to prior EKG Confirmed by Elayne Snare ([id number]) on 05/13/2023 9:30:47 PM  Radiology DG Chest Portable 1 View  Result Date: 05/13/2023 CLINICAL DATA:  Chest pain EXAM: PORTABLE CHEST 1 VIEW COMPARISON:  01/15/2023 FINDINGS: The heart size and mediastinal contours are within normal limits. Both lungs are clear. The visualized skeletal structures are unremarkable. IMPRESSION: No active disease. Electronically Signed   By: Jasmine Pang M.D.   On: 05/13/2023 21:42    Procedures Procedures    Medications Ordered in ED Medications  lactated ringers bolus 1,000 mL (1,000 mLs Intravenous New Bag/Given 05/13/23 2125)  potassium chloride SA (KLOR-CON M) CR tablet 40 mEq (40 mEq Oral Given 05/13/23 2218)  ketorolac (TORADOL) 15 MG/ML injection 15 mg (15 mg Intravenous Given 05/13/23 2218)  acetaminophen (TYLENOL) tablet 1,000 mg (1,000 mg Oral Given 05/13/23 2217)    ED Course/ Medical Decision Making/ A&P Clinical Course as of 05/13/23 2258  Fri May 13, 2023  2212 Troponin is negative. Chest pain resolved with rhythm returning to sinus so repeat troponin is not indicated. Mild leukocytosis but CBC does appear hemoconcentrated. Mild hypokalemia which will be repleted and may have contributed to a fib. [VK]    Clinical Course User Index [VK] Rexford Maus, DO                             Medical Decision Making This patient presents to the ED with chief complaint(s) of palpitations, chest pain with pertinent past medical history of HTN which further complicates the presenting complaint. The complaint involves an extensive differential diagnosis and also carries with it a high risk of complications and morbidity.    The differential diagnosis includes ACS, arrhythmia, anemia, electrolyte abnormality, dehydration, pneumonia,  pneumothorax, pulmonary edema, pleural effusion  Additional history obtained: Additional history obtained from N/A Records reviewed N/A  ED Course and Reassessment: On patient's arrival to the emergency department she was initially tachycardic between the 130s to the 150s and uncomfortable appearing but otherwise hemodynamically stable.  EKG on arrival did show A-fib with RVR.  Patient an IV axis obtained and labs were sent to evaluate for cause of her A-fib.  Was discussing the patient cardioversion versus rate control when the  patient spontaneously converted back into sinus rhythm.  Patient's labs are pending at this time and she will be monitored to ensure that she remains in sinus rhythm.  Independent labs interpretation:  The following labs were independently interpreted: Mild hypokalemia otherwise within normal range  Independent visualization of imaging: - I independently visualized the following imaging with scope of interpretation limited to determining acute life threatening conditions related to emergency care: Chest x-ray, which revealed no acute disease  Consultation: - Consulted or discussed management/test interpretation w/ external professional: N/A  Consideration for admission or further workup: Patient has no emergent conditions requiring admission or further work-up at this time and is stable for discharge home with primary care and cardiology follow-up  Social Determinants of health: N/A    Amount and/or Complexity of Data Reviewed Labs: ordered. Radiology: ordered.  Risk OTC drugs. Prescription drug management.          Final Clinical Impression(s) / ED Diagnoses Final diagnoses:  Atrial fibrillation with rapid ventricular response (HCC)  Hypokalemia    Rx / DC Orders ED Discharge Orders          Ordered    Amb Referral to AFIB Clinic        05/13/23 2257              Rexford Maus, DO 05/13/23 2258

## 2023-05-13 NOTE — ED Triage Notes (Addendum)
Pt c/o centralized chest pain that started approx 30 min pta. Has hx of SVT.

## 2023-05-26 ENCOUNTER — Ambulatory Visit (HOSPITAL_COMMUNITY)
Admission: RE | Admit: 2023-05-26 | Discharge: 2023-05-26 | Disposition: A | Discharge status: Home / Self Care | Payer: No Typology Code available for payment source | Source: Ambulatory Visit | Attending: Physician Assistant | Admitting: Physician Assistant

## 2023-05-26 ENCOUNTER — Inpatient Hospital Stay (HOSPITAL_COMMUNITY)
Admission: RE | Admit: 2023-05-26 | Discharge: 2023-05-26 | Disposition: A | Discharge status: Home / Self Care | Payer: No Typology Code available for payment source | Source: Ambulatory Visit

## 2023-05-26 ENCOUNTER — Encounter (HOSPITAL_COMMUNITY): Payer: Self-pay | Admitting: Physician Assistant

## 2023-05-26 VITALS — BP 124/72 | HR 100 | Ht 62.0 in | Wt 225.0 lb

## 2023-05-26 DIAGNOSIS — I480 Paroxysmal atrial fibrillation: Secondary | ICD-10-CM | POA: Insufficient documentation

## 2023-05-26 DIAGNOSIS — E669 Obesity, unspecified: Secondary | ICD-10-CM | POA: Insufficient documentation

## 2023-05-26 DIAGNOSIS — I10 Essential (primary) hypertension: Secondary | ICD-10-CM | POA: Diagnosis not present

## 2023-05-26 DIAGNOSIS — Z7182 Exercise counseling: Secondary | ICD-10-CM | POA: Insufficient documentation

## 2023-05-26 DIAGNOSIS — R400 Somnolence: Secondary | ICD-10-CM | POA: Insufficient documentation

## 2023-05-26 DIAGNOSIS — Z6841 Body Mass Index (BMI) 40.0 and over, adult: Secondary | ICD-10-CM | POA: Diagnosis not present

## 2023-05-26 DIAGNOSIS — Z79899 Other long term (current) drug therapy: Secondary | ICD-10-CM | POA: Diagnosis not present

## 2023-05-26 DIAGNOSIS — R0683 Snoring: Secondary | ICD-10-CM | POA: Insufficient documentation

## 2023-05-26 MED ORDER — DILTIAZEM HCL 30 MG PO TABS: ORAL_TABLET | ORAL | 1 refills | Status: AC

## 2023-05-26 NOTE — Progress Notes (Addendum)
Primary Care Physician: [place], Betty G, MD Primary Cardiologist: Dr Sharyn Lull  Primary Electrophysiologist: none Referring Physician: [facility]   Yolanda Holmes is a 47 y.o. female with a history of HTN, SVT, and atrial fibrillation who presents for consultation in the [facility].  The patient was initially diagnosed with atrial fibrillation 05/13/23 after presenting to the ED with symptoms of tachypalpitations and chest pressure. ECG showed afib with RVR. During assessment, she spontaneously converted to SR. Patient has a CHADS2VASC score of 2.  Today, patient reports that she has on and off palpitations several times per week, not a severe as the day she went to the ED. No other associated symptoms. She denies alcohol use but does admit to snoring and daytime somnolence.   Today, she denies symptoms of chest pain, shortness of breath, orthopnea, PND, lower extremity edema, dizziness, presyncope, syncope, bleeding, or neurologic sequela. The patient is tolerating medications without difficulties and is otherwise without complaint today.    Atrial Fibrillation Risk Factors:  she does have symptoms or diagnosis of sleep apnea. she is agreeable to sleep study.  she does not have a history of rheumatic fever. she does not have a history of alcohol use. The patient does not have a history of early familial atrial fibrillation or other arrhythmias.  she has a BMI of Body mass index is 41.15 kg/m.Marland Kitchen Filed Weights   05/26/23 1339  Weight: 102.1 kg    Family History  Problem Relation Age of Onset   Hypertension Mother    Diabetes Mother      Atrial Fibrillation Management history:  Previous antiarrhythmic drugs: none Previous cardioversions: none Previous ablations: none Anticoagulation history: none   Past Medical History:  Diagnosis Date   Gestational diabetes    Opiate addiction (HCC)    Single vessel of umbilical cord    SVT  (supraventricular tachycardia)    resolved since discontinuing opiate abuse   Past Surgical History:  Procedure Laterality Date   NO PAST SURGERIES      Current Outpatient Medications  Medication Sig Dispense Refill   losartan (COZAAR) 100 MG tablet Take 100 mg by mouth daily.     metoprolol succinate (TOPROL-XL) 100 MG 24 hr tablet Take 100 mg by mouth daily. Take with or immediately following a meal.     Multiple Vitamin (MULTIVITAMIN) tablet Take 1 tablet by mouth daily.     nicotine (NICODERM CQ - DOSED IN MG/24 HOURS) 14 mg/24hr patch Place 1 patch (14 mg total) onto the skin daily. 28 patch 0   DULoxetine (CYMBALTA) 30 MG capsule Take 1 capsule (30 mg total) by mouth daily. (Patient not taking: Reported on 05/26/2023) 90 capsule 1   No current facility-administered medications for this encounter.    No Known Allergies  Social History   Socioeconomic History   Marital status: Media planner    Spouse name: Not on file   Number of children: Not on file   Years of education: Not on file   Highest education level: Not on file  Occupational History   Not on file  Tobacco Use   Smoking status: Every Day    Packs/day: 1    Types: Cigarettes   Smokeless tobacco: Never  Vaping Use   Vaping Use: Never used  Substance and Sexual Activity   Alcohol use: No   Drug use: No    Comment: former opiate abuser   Sexual activity: Not on file  Other Topics  Concern   Not on file  Social History Narrative   Not on file   Social Determinants of Health   Financial Resource Strain: Not on file  Food Insecurity: Not on file  Transportation Needs: Not on file  Physical Activity: Not on file  Stress: Not on file  Social Connections: Not on file  Intimate Partner Violence: Not on file     ROS- All systems are reviewed and negative except as per the HPI above.  Physical Exam: Vitals:   05/26/23 1339  BP: 124/72  Pulse: 100  Weight: 102.1 kg  Height: 5\' 2"  (1.575 m)     GEN- The patient is a well appearing female, alert and oriented x 3 today.   Head- normocephalic, atraumatic Eyes-  Sclera clear, conjunctiva pink Ears- hearing intact Oropharynx- clear Neck- supple  Lungs- Clear to ausculation bilaterally, normal work of breathing Heart- Regular rate and rhythm, no murmurs, rubs or gallops  GI- soft, NT, ND, + BS Extremities- no clubbing, cyanosis, or edema MS- no significant deformity or atrophy Skin- no rash or lesion Psych- euthymic mood, full affect Neuro- strength and sensation are intact  Wt Readings from Last 3 Encounters:  05/26/23 102.1 kg  05/13/23 102.1 kg  01/19/23 102.1 kg    EKG today demonstrates  SR Vent. rate 100 BPM PR interval 150 ms QRS duration 78 ms QT/QTcB 346/446 ms   Epic records are reviewed at length today.  CHA2DS2-VASc Score = 2  The patient's score is based upon: CHF History: 0 HTN History: 1 Diabetes History: 0 Stroke History: 0 Vascular Disease History: 0 Age Score: 0 Gender Score: 1       ASSESSMENT AND PLAN: 1. Paroxysmal Atrial Fibrillation (ICD10:  I48.0) The patient's CHA2DS2-VASc score is 2, indicating a 2.2% annual risk of stroke.  General education about afib provided and questions answered. We also discussed her stroke risk and the risks and benefits of anticoagulation. Will request records from Dr Sharyn Lull.  Check echocardiogram Continue Toprol 100 mg daily We discussed rhythm control options today including AAD and ablation. Her BMI is borderline for ablation. Would consider starting flecainide if her echo shows no structural heart disease.  Will start diltiazem 30 mg PRN q 4 hours for heart racing.  Not currently on anticoagulation with a low CV score.   2. HTN Stable, no changes today  3. Obesity Body mass index is 41.15 kg/m. Lifestyle modification was discussed at length including regular exercise and weight reduction.  4. Snoring/daytime somnolence  The importance of  adequate treatment of sleep apnea was discussed today in order to improve our ability to maintain sinus rhythm long term. Will refer for sleep study.    Follow up in the AF clinic in one month.    Addendum: Echo from Dr Annitta Jersey office 01/23/21 LV systolic function normal EF 55-60% Normal RV function Trace MR Trace TR Normal LA and RA size   Jorja Loa PA-C Af[facility] [address] [place], [place] [address] [phone] 05/26/2023 1:51 PM

## 2023-05-26 NOTE — Addendum Note (Signed)
Encounter addended by: Danice Goltz, PA on: 05/26/2023 3:44 PM  Actions taken: Clinical Note Signed

## 2023-05-26 NOTE — Patient Instructions (Signed)
Cardizem 30mg -- Take 1 tablet every 4 hours AS NEEDED for AFIB heart rate >100 as long as top BP >100.    

## 2023-06-09 ENCOUNTER — Ambulatory Visit (HOSPITAL_COMMUNITY): Admission: RE | Admit: 2023-06-09 | Payer: No Typology Code available for payment source | Source: Ambulatory Visit

## 2023-07-05 ENCOUNTER — Ambulatory Visit (HOSPITAL_BASED_OUTPATIENT_CLINIC_OR_DEPARTMENT_OTHER)
Payer: No Typology Code available for payment source | Attending: Physician Assistant | Admitting: Cardiovascular Disease

## 2023-07-11 ENCOUNTER — Ambulatory Visit (HOSPITAL_COMMUNITY): Payer: No Typology Code available for payment source | Admitting: Physician Assistant

## 2023-11-09 DIAGNOSIS — K2961 Other gastritis with bleeding: Secondary | ICD-10-CM | POA: Diagnosis not present

## 2023-11-09 DIAGNOSIS — R1011 Right upper quadrant pain: Secondary | ICD-10-CM | POA: Diagnosis present

## 2023-11-10 ENCOUNTER — Emergency Department (HOSPITAL_COMMUNITY): Payer: No Typology Code available for payment source

## 2023-11-10 ENCOUNTER — Emergency Department (HOSPITAL_COMMUNITY)
Admission: EM | Admit: 2023-11-10 | Discharge: 2023-11-10 | Disposition: A | Discharge status: Home / Self Care | Payer: No Typology Code available for payment source | Attending: Emergency Medicine | Admitting: Emergency Medicine

## 2023-11-10 ENCOUNTER — Encounter (HOSPITAL_COMMUNITY): Payer: Self-pay

## 2023-11-10 ENCOUNTER — Other Ambulatory Visit: Payer: Self-pay

## 2023-11-10 DIAGNOSIS — K2901 Acute gastritis with bleeding: Secondary | ICD-10-CM

## 2023-11-10 LAB — CBC
HCT: 40.9 % (ref 36.0–46.0)
Hemoglobin: 14 g/dL (ref 12.0–15.0)
MCH: 32.2 pg (ref 26.0–34.0)
MCHC: 34.2 g/dL (ref 30.0–36.0)
MCV: 94 fL (ref 80.0–100.0)
Platelets: 346 10*3/uL (ref 150–400)
RBC: 4.35 MIL/uL (ref 3.87–5.11)
RDW: 11.8 % (ref 11.5–15.5)
WBC: 9.7 10*3/uL (ref 4.0–10.5)
nRBC: 0 % (ref 0.0–0.2)

## 2023-11-10 LAB — URINALYSIS, ROUTINE W REFLEX MICROSCOPIC
Bilirubin Urine: NEGATIVE
Glucose, UA: NEGATIVE mg/dL
Hgb urine dipstick: NEGATIVE
Ketones, ur: NEGATIVE mg/dL
Leukocytes,Ua: NEGATIVE
Nitrite: NEGATIVE
Protein, ur: NEGATIVE mg/dL
Specific Gravity, Urine: 1.006 (ref 1.005–1.030)
pH: 6 (ref 5.0–8.0)

## 2023-11-10 LAB — TYPE AND SCREEN
ABO/RH(D): A POS
Antibody Screen: NEGATIVE

## 2023-11-10 LAB — COMPREHENSIVE METABOLIC PANEL
ALT: 30 U/L (ref 0–44)
AST: 17 U/L (ref 15–41)
Albumin: 3.8 g/dL (ref 3.5–5.0)
Alkaline Phosphatase: 69 U/L (ref 38–126)
Anion gap: 9 (ref 5–15)
BUN: 11 mg/dL (ref 6–20)
CO2: 24 mmol/L (ref 22–32)
Calcium: 9.1 mg/dL (ref 8.9–10.3)
Chloride: 108 mmol/L (ref 98–111)
Creatinine, Ser: 0.9 mg/dL (ref 0.44–1.00)
GFR, Estimated: >60 mL/min (ref >60)
Glucose, Bld: 106 mg/dL — ABNORMAL HIGH (ref 70–99)
Potassium: 3.8 mmol/L (ref 3.5–5.1)
Sodium: 141 mmol/L (ref 135–145)
Total Bilirubin: 0.5 mg/dL (ref <1.2)
Total Protein: 6.6 g/dL (ref 6.5–8.1)

## 2023-11-10 LAB — LIPASE, BLOOD: Lipase: 36 U/L (ref 11–51)

## 2023-11-10 LAB — HCG, SERUM, QUALITATIVE: Preg, Serum: NEGATIVE

## 2023-11-10 MED ORDER — PANTOPRAZOLE SODIUM 40 MG IV SOLR
80.0000 mg | Freq: Once | INTRAVENOUS | Status: AC
  Administered 2023-11-10: 80 mg via INTRAVENOUS
  Filled 2023-11-10: qty 20

## 2023-11-10 MED ORDER — IOHEXOL 350 MG/ML SOLN
75.0000 mL | Freq: Once | INTRAVENOUS | Status: AC | PRN
  Administered 2023-11-10: 75 mL via INTRAVENOUS

## 2023-11-10 MED ORDER — OMEPRAZOLE 20 MG PO CPDR: 20.0000 mg | DELAYED_RELEASE_CAPSULE | Freq: Every day | ORAL | 2 refills | Status: DC

## 2023-11-10 NOTE — Discharge Instructions (Signed)
You were seen today with concerns for vomiting blood.  Your workup today is largely reassuring.  Your hemoglobin is stable.  CT suggest evidence of gastritis.  Start omeprazole daily.  Avoid NSAIDs such as ibuprofen or naproxen.  You should follow-up with gastroenterology for possible endoscopy.

## 2023-11-10 NOTE — ED Triage Notes (Addendum)
Pt arrives to ED c/o LRQ abdominal pain x 1 day. PT reports nausea and having 2 episodes of bright red blood in vomit. Pt denies alcohol use and endorses hx of acid reflux. Pt denies thinners, CP, and SOB

## 2023-11-10 NOTE — ED Provider Notes (Signed)
[facility] EM[facility] AT [facility] Provider Note   CSN: [id number] Arrival date & time: 11/09/23  2356     History  Chief Complaint  Patient presents with   Abdominal Pain    Yolanda Holmes is a 47 y.o. female.  HPI     This is a 47 year old female who presents for right upper quadrant pain.  Patient reports that she has had pain for 1 day.  She had an episode of nausea and vomiting earlier this evening.  She reports that she had 2 episodes where she had frank bloody emesis.  No history of the same.  She denies any alcohol abuse but does take ibuprofen daily.  Does not take blood thinners.  Home Medications Prior to Admission medications   Medication Sig Start Date End Date Taking? Authorizing Provider  diltiazem (CARDIZEM) 30 MG tablet Take 1 tablet every 4 hours AS NEEDED for AFIB heart rate >100 as long as top BP >100. 05/26/23  Yes Fenton, Clint R, PA  losartan (COZAAR) 100 MG tablet Take 100 mg by mouth daily.   Yes [provider]  metoprolol succinate (TOPROL-XL) 100 MG 24 hr tablet Take 100 mg by mouth daily. Take with or immediately following a meal.   Yes [provider]  Multiple Vitamin (MULTIVITAMIN) tablet Take 1 tablet by mouth daily.   Yes [provider]  nicotine (NICODERM CQ - DOSED IN MG/24 HOURS) 14 mg/24hr patch Place 1 patch (14 mg total) onto the skin daily. 01/28/23  Yes [place], Betty G, MD  omeprazole (PRILOSEC) 20 MG capsule Take 1 capsule (20 mg total) by mouth daily. 11/10/23  Yes Kimyatta Lecy, Mayer Masker, MD      Allergies    Patient has no known allergies.    Review of Systems   Review of Systems  Constitutional:  Negative for fever.  Gastrointestinal:  Positive for abdominal pain, nausea and vomiting. Negative for blood in stool.  All other systems reviewed and are negative.   Physical Exam Updated Vital Signs BP (!) 132/90   Pulse 64   Temp 98 F (36.7 C) (Oral)   Resp 16   Ht 1.575 m  (5\' 2" )   Wt 90.7 kg   SpO2 100%   BMI 36.58 kg/m  Physical Exam Vitals and nursing note reviewed.  Constitutional:      Appearance: She is well-developed. She is obese.  HENT:     Head: Normocephalic and atraumatic.  Eyes:     Pupils: Pupils are equal, round, and reactive to light.  Cardiovascular:     Rate and Rhythm: Normal rate and regular rhythm.     Heart sounds: Normal heart sounds.  Pulmonary:     Effort: Pulmonary effort is normal. No respiratory distress.     Breath sounds: No wheezing.  Abdominal:     General: Bowel sounds are normal.     Palpations: Abdomen is soft.     Tenderness: There is abdominal tenderness in the right upper quadrant. There is no rebound.     Comments: Voluntary guarding  Musculoskeletal:     Cervical back: Neck supple.  Skin:    General: Skin is warm and dry.  Neurological:     Mental Status: She is alert and oriented to person, place, and time.     ED Results / Procedures / Treatments   Labs (all labs ordered are listed, but only abnormal results are displayed) Labs Reviewed  COMPREHENSIVE METABOLIC PANEL - Abnormal; Notable  for the following components:      Result Value   Glucose, Bld 106 (*)    All other components within normal limits  URINALYSIS, ROUTINE W REFLEX MICROSCOPIC - Abnormal; Notable for the following components:   Color, Urine STRAW (*)    All other components within normal limits  CBC  LIPASE, BLOOD  HCG, SERUM, QUALITATIVE  TYPE AND SCREEN    EKG None  Radiology CT ABDOMEN PELVIS W CONTRAST  Result Date: 11/10/2023 CLINICAL DATA:  Abdominal pain, acute, nonlocalized. Nausea and hematemesis. History of acid reflux. EXAM: CT ABDOMEN AND PELVIS WITH CONTRAST TECHNIQUE: Multidetector CT imaging of the abdomen and pelvis was performed using the standard protocol following bolus administration of intravenous contrast. RADIATION DOSE REDUCTION: This exam was performed according to the departmental  dose-optimization program which includes automated exposure control, adjustment of the mA and/or kV according to patient size and/or use of iterative reconstruction technique. CONTRAST:  75mL OMNIPAQUE IOHEXOL 350 MG/ML SOLN COMPARISON:  None Available. FINDINGS: Lower chest: No acute abnormality. Hepatobiliary: No focal liver abnormality is seen. No gallstones, gallbladder wall thickening, or biliary dilatation. Pancreas: Unremarkable. No pancreatic ductal dilatation or surrounding inflammatory changes. Spleen: Normal in size without focal abnormality. Adrenals/Urinary Tract: The adrenal glands are within normal limits. The kidneys enhance symmetrically. No renal calculus or obstructive uropathy bilaterally. The bladder is unremarkable. Stomach/Bowel: A small hiatal hernia is noted. There is thickening of the walls of the gastric rugae. No bowel obstruction, free air, or pneumatosis. Scattered diverticula are noted along the colon without evidence of diverticulitis. Appendix appears normal. Vascular/Lymphatic: No significant vascular findings are present. No enlarged abdominal or pelvic lymph nodes. Reproductive: An IUD is present in the uterus.  No adnexal mass. Other: No intra-abdominal free fluid. Small fat containing umbilical hernia is present. Musculoskeletal: Mild degenerative changes are noted in the lower lumbar spine. No acute osseous abnormality is seen. IMPRESSION: 1. Findings compatible with gastritis. 2. Small hiatal hernia. 3. Diverticulosis without diverticulitis. Electronically Signed   By: Thornell Sartorius M.D.   On: 11/10/2023 04:58   DG Abdomen 1 View  Result Date: 11/10/2023 CLINICAL DATA:  Right flank pain EXAM: ABDOMEN - 1 VIEW COMPARISON:  None Available. FINDINGS: Liver appears prominent. Nonobstructive bowel gas pattern. No free air or suspicious calcification. IUD in the uterus. IMPRESSION: Prominent liver, recommend clinical correlation for possible hepatomegaly. Electronically Signed    By: Charlett Nose M.D.   On: 11/10/2023 01:58    Procedures Procedures    Medications Ordered in ED Medications  pantoprazole (PROTONIX) injection 80 mg (80 mg Intravenous Given 11/10/23 0324)  iohexol (OMNIPAQUE) 350 MG/ML injection 75 mL (75 mLs Intravenous Contrast Given 11/10/23 0449)    ED Course/ Medical Decision Making/ A&P                                 Medical Decision Making Amount and/or Complexity of Data Reviewed Labs: ordered. Radiology: ordered.  Risk Prescription drug management.   This patient presents to the ED for concern of abdominal pain, vomiting, this involves an extensive number of treatment options, and is a complaint that carries with it a high risk of complications and morbidity.  I considered the following differential and admission for this acute, potentially life threatening condition.  The differential diagnosis includes gastritis, reflux, pancreatitis, cholecystitis, acute upper GI bleed  MDM:    This is a 47 year old female who presents with upper  abdominal pain.  She is nontoxic and vital signs are reassuring.  Has had daily NSAID use but denies any alcohol abuse.  Labs obtained and reviewed.  Hemoglobin of 14.0.  No significant metabolic derangements.  Patient has not had any recurrent emesis while in the emergency department.  She declines pain medication.  CT scan obtained given prominent liver on plain films.  CT scan does not show any significant liver findings but does show findings compatible with gastritis which would clinically correlate.  She also has a small hiatal hernia and diverticulosis without diverticulitis.  Discussed this with the patient.  She has had no recurrent emesis or bleeding.  Low suspicion for esophageal varices or acute emergent upper GI bleed.  Will start on omeprazole at home.  She needs to avoid NSAIDs.  She was given gastroenterology follow-up and strict return precautions.  (Labs, imaging, consults)  Labs: I  Ordered, and personally interpreted labs.  The pertinent results include: CBC, CMP, lipase, urinalysis  Imaging Studies ordered: I ordered imaging studies including plain film, CT I independently visualized and interpreted imaging. I agree with the radiologist interpretation  Additional history obtained from chart review.  External records from outside source obtained and reviewed including prior evaluations  Cardiac Monitoring: The patient was maintained on a cardiac monitor.  If on the cardiac monitor, I personally viewed and interpreted the cardiac monitored which showed an underlying rhythm of: Sinus rhythm  Reevaluation: After the interventions noted above, I reevaluated the patient and found that they have :improved  Social Determinants of Health:  lives independently  Disposition: Discharge  Co morbidities that complicate the patient evaluation  Past Medical History:  Diagnosis Date   Gestational diabetes    Opiate addiction (HCC)    Single vessel of umbilical cord    SVT (supraventricular tachycardia) (HCC)    resolved since discontinuing opiate abuse     Medicines Meds ordered this encounter  Medications   pantoprazole (PROTONIX) injection 80 mg   iohexol (OMNIPAQUE) 350 MG/ML injection 75 mL   omeprazole (PRILOSEC) 20 MG capsule    Sig: Take 1 capsule (20 mg total) by mouth daily.    Dispense:  30 capsule    Refill:  2    I have reviewed the patients home medicines and have made adjustments as needed  Problem List / ED Course: Problem List Items Addressed This Visit   None Visit Diagnoses     Other acute gastritis with hemorrhage    -  Primary                   Final Clinical Impression(s) / ED Diagnoses Final diagnoses:  Other acute gastritis with hemorrhage    Rx / DC Orders ED Discharge Orders          Ordered    omeprazole (PRILOSEC) 20 MG capsule  Daily        11/10/23 0540              Shon Baton, MD 11/10/23  [phone]

## 2023-11-11 ENCOUNTER — Telehealth (INDEPENDENT_AMBULATORY_CARE_PROVIDER_SITE_OTHER): Payer: No Typology Code available for payment source | Admitting: Family Medicine

## 2023-11-11 ENCOUNTER — Encounter: Payer: Self-pay | Admitting: Family Medicine

## 2023-11-11 VITALS — Ht 62.0 in

## 2023-11-11 DIAGNOSIS — R1310 Dysphagia, unspecified: Secondary | ICD-10-CM | POA: Diagnosis not present

## 2023-11-11 DIAGNOSIS — K219 Gastro-esophageal reflux disease without esophagitis: Secondary | ICD-10-CM | POA: Diagnosis not present

## 2023-11-11 DIAGNOSIS — K920 Hematemesis: Secondary | ICD-10-CM | POA: Diagnosis not present

## 2023-11-11 MED ORDER — OMEPRAZOLE 40 MG PO CPDR
40.0000 mg | DELAYED_RELEASE_CAPSULE | Freq: Every day | ORAL | 0 refills | Status: DC
Start: 2023-11-11 — End: 2023-11-11

## 2023-11-11 MED ORDER — OMEPRAZOLE 40 MG PO CPDR
40.0000 mg | DELAYED_RELEASE_CAPSULE | Freq: Every day | ORAL | 0 refills | Status: AC
Start: 2023-11-11 — End: ?

## 2023-11-11 NOTE — Progress Notes (Signed)
Virtual Visit via Video Note I connected with Yolanda Holmes on 11/11/2023 by a video enabled telemedicine application and verified that I am speaking with the correct person using two identifiers. Location patient: home Location provider: work office Persons participating in the virtual visit: patient, provider, and scribe  I discussed the limitations of evaluation and management by telemedicine and the availability of in person appointments. The patient expressed understanding and agreed to proceed.  Chief Complaint  Patient presents with   Follow-up    No more vomiting, still having pain on the right side    HPI: Pt presented to the ED on 11/10/23 with RUQ abdominal pain x1 day. Per ED note, she also endorsed nausea and vomiting earlier yesterday with 2 episodes of bloody emesis.   Today, she continues to experience RUQ abdominal pain, rating her pain a 1/10. Her nausea and vomiting is resolved today.  No prior hx of hematemesis prior to her 2 episodes yesterday. She states her vomiting was bright red in color.  Abdominal pain is not exacerbated by eating and she denies changes in appetite.  Negative for fever, chills, CP, dyspnea, cough, melena, blood in the stool, or urinary symptoms. Lab Results  Component Value Date   NA 141 11/10/2023   CL 108 11/10/2023   K 3.8 11/10/2023   CO2 24 11/10/2023   BUN 11 11/10/2023   CREATININE 0.90 11/10/2023   GFRNONAA >60 11/10/2023   CALCIUM 9.1 11/10/2023   ALBUMIN 3.8 11/10/2023   GLUCOSE 106 (H) 11/10/2023   Lab Results  Component Value Date   ALT 30 11/10/2023   AST 17 11/10/2023   ALKPHOS 69 11/10/2023   BILITOT 0.5 11/10/2023   Lab Results  Component Value Date   WBC 9.7 11/10/2023   HGB 14.0 11/10/2023   HCT 40.9 11/10/2023   MCV 94.0 11/10/2023   PLT 346 11/10/2023   CT abdomen/pelvis with contrast: Findings compatible with gastritis, a small hiatal hernia, and diverticulosis without diverticulitis.  She was  prescribed omeprazole 20 mg daily when discharged from the ED, but she has not yet picked this up from the pharmacy.   GERD:She also complains of severe acid reflux with associated dysphagia, the latter one for a while and when eating solids. Her symptoms have worsened since 2021 after pregnancy. Her acid reflux occurs even after drinking water. Despite cutting pasta and heavy carb intake her acid reflux continues to worsen. She has, however, lost about 30 lbs since making this change to her diet 4 months ago. Currently she is not taking any medication to manage her acid reflux. States that tends to be hesitant to self-start medications due to hx of oxycodone abuse. She avoids taking tylenol for this reason and instead has been taking 3 ibuprofen daily to manage chronic back pain. Denies any heartburn.   Of note, she continues to smoke, but as been working on cessation. Does not drink alcohol.   Since her last in-person visit on 01/19/23 she has been dx with A-fib.  She is not on chronic anticoagulation, follows with cardiology regularly.  ROS: See pertinent positives and negatives per HPI.  Past Medical History:  Diagnosis Date   Gestational diabetes    Opiate addiction (HCC)    Single vessel of umbilical cord    SVT (supraventricular tachycardia) (HCC)    resolved since discontinuing opiate abuse   Past Surgical History:  Procedure Laterality Date   NO PAST SURGERIES     Family History  Problem Relation  Age of Onset   Hypertension Mother    Diabetes Mother    Social History   Socioeconomic History   Marital status: Media planner    Spouse name: Not on file   Number of children: Not on file   Years of education: Not on file   Highest education level: Not on file  Occupational History   Not on file  Tobacco Use   Smoking status: Every Day    Current packs/day: 1.00    Types: Cigarettes   Smokeless tobacco: Never  Vaping Use   Vaping status: Never Used  Substance and  Sexual Activity   Alcohol use: No   Drug use: No    Comment: former opiate abuser   Sexual activity: Not on file  Other Topics Concern   Not on file  Social History Narrative   Not on file   Social Determinants of Health   Financial Resource Strain: Not on file  Food Insecurity: Not on file  Transportation Needs: Not on file  Physical Activity: Not on file  Stress: Not on file  Social Connections: Not on file  Intimate Partner Violence: Not on file    Current Outpatient Medications:    diltiazem (CARDIZEM) 30 MG tablet, Take 1 tablet every 4 hours AS NEEDED for AFIB heart rate >100 as long as top BP >100., Disp: 30 tablet, Rfl: 1   losartan (COZAAR) 100 MG tablet, Take 100 mg by mouth daily., Disp: , Rfl:    metoprolol succinate (TOPROL-XL) 100 MG 24 hr tablet, Take 100 mg by mouth daily. Take with or immediately following a meal., Disp: , Rfl:    Multiple Vitamin (MULTIVITAMIN) tablet, Take 1 tablet by mouth daily., Disp: , Rfl:    nicotine (NICODERM CQ - DOSED IN MG/24 HOURS) 14 mg/24hr patch, Place 1 patch (14 mg total) onto the skin daily., Disp: 28 patch, Rfl: 0   omeprazole (PRILOSEC) 40 MG capsule, Take 1 capsule (40 mg total) by mouth daily., Disp: 90 capsule, Rfl: 0  EXAM:  VITALS per patient if applicable:Ht 5\' 2"  (1.575 m)   BMI 36.58 kg/m   GENERAL: alert, oriented, appears well and in no acute distress  HEENT: atraumatic, conjunctiva clear, no obvious abnormalities on inspection of external nose and ears  NECK: normal movements of the head and neck  LUNGS: on inspection no signs of respiratory distress, breathing rate appears normal, no obvious gross SOB, gasping or wheezing  CV: no obvious cyanosis  MS: moves all visible extremities without noticeable abnormality  PSYCH/NEURO: pleasant and cooperative, no obvious depression or anxiety, speech and thought processing grossly intact  ASSESSMENT AND PLAN: Discussed the following assessment and  plan:  Dysphagia, unspecified type - Plan: Ambulatory referral to Gastroenterology, omeprazole (PRILOSEC) 40 MG capsule  Gastroesophageal reflux disease, unspecified whether esophagitis present - Plan: Ambulatory referral to Gastroenterology, omeprazole (PRILOSEC) 40 MG capsule  Hematemesis with nausea - Plan: Ambulatory referral to Gastroenterology, omeprazole (PRILOSEC) 40 MG capsule  Dysphagia, unspecified type This seems to be a chronic problem. We discussed possible etiologies, may need esophageal dilation. Omeprazole 40 mg daily started today. Instructed about warning signs.  -     Ambulatory referral to Gastroenterology -     Omeprazole; Take 1 capsule (40 mg total) by mouth daily.  Dispense: 90 capsule; Refill: 0  Gastroesophageal reflux disease, unspecified whether esophagitis present Chronic, she has not been on any medication to treat this problem. Omeprazole 40 mg 30-minute before breakfast started today. GERD precautions to  continue.  -     Ambulatory referral to Gastroenterology -     Omeprazole; Take 1 capsule (40 mg total) by mouth daily.  Dispense: 90 capsule; Refill: 0  Hematemesis with nausea Resolved. Instructed to avoid aspirin and NSAID's in general. Omeprazole 40 mg daily started today. Clearly instructed about warning signs.  -     Ambulatory referral to Gastroenterology -     Omeprazole; Take 1 capsule (40 mg total) by mouth daily.  Dispense: 90 capsule; Refill: 0   We discussed possible serious and likely etiologies, options for evaluation and workup, limitations of telemedicine visit vs in person visit, treatment, treatment risks and precautions. The patient was advised to call back or seek an in-person evaluation if the symptoms worsen or if the condition fails to improve as anticipated. I discussed the assessment and treatment plan with the patient. The patient was provided an opportunity to ask questions and all were answered. The patient agreed with  the plan and demonstrated an understanding of the instructions.  Return if symptoms worsen or fail to improve, for keep next appointment.  I, Isabelle Course, acting as a scribe for Yolanda Fiola [place], MD., have documented all relevant documentation on the behalf of Yolanda Toste [place], MD, as directed by  Yolanda Laughter [place], MD while in the presence of Yolanda Longoria [place], MD.  I, Aubert Choyce [place], MD, have reviewed all documentation for this visit. The documentation on 11/11/23 for the exam, diagnosis, procedures, and orders are all accurate and complete.  Yolanda Caridi [place], MD

## 2024-08-26 ENCOUNTER — Telehealth: Payer: Self-pay | Admitting: Family

## 2024-08-26 DIAGNOSIS — J208 Acute bronchitis due to other specified organisms: Secondary | ICD-10-CM

## 2024-08-26 DIAGNOSIS — F17200 Nicotine dependence, unspecified, uncomplicated: Secondary | ICD-10-CM

## 2024-08-26 DIAGNOSIS — B9689 Other specified bacterial agents as the cause of diseases classified elsewhere: Secondary | ICD-10-CM

## 2024-08-26 MED ORDER — PREDNISONE 10 MG (21) PO TBPK
ORAL_TABLET | ORAL | 0 refills | Status: AC
Start: 2024-08-26 — End: ?

## 2024-08-26 MED ORDER — AZITHROMYCIN 250 MG PO TABS
ORAL_TABLET | ORAL | 0 refills | Status: AC
Start: 2024-08-26 — End: ?

## 2024-08-26 MED ORDER — ALBUTEROL SULFATE HFA 108 (90 BASE) MCG/ACT IN AERS
2.0000 | INHALATION_SPRAY | Freq: Four times a day (QID) | RESPIRATORY_TRACT | 0 refills | Status: AC | PRN
Start: 2024-08-26 — End: ?

## 2024-08-26 NOTE — Progress Notes (Signed)
 Virtual Visit Consent   Yolanda Holmes, you are scheduled for a virtual visit with a [facility] provider today. Just as with appointments in the office, your consent must be obtained to participate. Your consent will be active for this visit and any virtual visit you may have with one of our providers in the next 365 days. If you have a MyChart account, a copy of this consent can be sent to you electronically.  As this is a virtual visit, video technology does not allow for your provider to perform a traditional examination. This may limit your provider's ability to fully assess your condition. If your provider identifies any concerns that need to be evaluated in person or the need to arrange testing (such as labs, EKG, etc.), we will make arrangements to do so. Although advances in technology are sophisticated, we cannot ensure that it will always work on either your end or our end. If the connection with a video visit is poor, the visit may have to be switched to a telephone visit. With either a video or telephone visit, we are not always able to ensure that we have a secure connection.  By engaging in this virtual visit, you consent to the provision of healthcare and authorize for your insurance to be billed (if applicable) for the services provided during this visit. Depending on your insurance coverage, you may receive a charge related to this service.  I need to obtain your verbal consent now. Are you willing to proceed with your visit today? Yolanda Holmes has provided verbal consent on 08/26/2024 for a virtual visit (video or telephone). Bari Learn, FNP  Date: 08/26/2024 7:50 PM   Virtual Visit via Video Note   I, Bari Learn, connected with  Yolanda Holmes  ([id number], [birth year]) on 08/26/24 at  7:45 PM EDT by a video-enabled telemedicine application and verified that I am speaking with the correct person using two identifiers.  Location: Patient: Virtual Visit  Location Patient: Home Provider: Virtual Visit Location Provider: Home Office   I discussed the limitations of evaluation and management by telemedicine and the availability of in person appointments. The patient expressed understanding and agreed to proceed.    History of Present Illness: Yolanda Holmes is a 48 y.o. who identifies as a female who was assigned female at birth, and is being seen today for cough that over a week.  HPI: Cough This is a new problem. The current episode started 1 to 4 weeks ago. The problem has been gradually worsening. The problem occurs every few minutes. The cough is Non-productive. Associated symptoms include chills, a fever, headaches, myalgias, nasal congestion, postnasal drip, a sore throat, shortness of breath and wheezing. Pertinent negatives include no ear congestion or ear pain. Risk factors for lung disease include smoking/tobacco exposure. She has tried rest and OTC cough suppressant for the symptoms. The treatment provided mild relief.    Problems:  Patient Active Problem List   Diagnosis Date Noted   Paroxysmal atrial fibrillation (HCC) 05/26/2023   Tobacco use disorder 01/19/2023   Lumbar back pain with radiculopathy affecting right lower extremity 01/19/2023   Essential (primary) hypertension 01/19/2023   GDM (gestational diabetes mellitus) 04/05/2020   History of substance use disorder 04/05/2020   Normal postpartum course 04/05/2020   SVD (spontaneous vaginal delivery) 04/04/2020   Gestational hypertension 04/03/2020   Supervision of high risk pregnancy, antepartum 10/24/2019    Allergies: No Known Allergies Medications:  Current Outpatient Medications:  albuterol  (VENTOLIN  HFA) 108 (90 Base) MCG/ACT inhaler, Inhale 2 puffs into the lungs every 6 (six) hours as needed for wheezing or shortness of breath., Disp: 8 g, Rfl: 0   azithromycin  (ZITHROMAX ) 250 MG tablet, Take 500 mg once, then 250 mg for four days, Disp: 6 tablet, Rfl:  0   predniSONE  (STERAPRED UNI-PAK 21 TAB) 10 MG (21) TBPK tablet, Use as directed, Disp: 21 tablet, Rfl: 0   diltiazem  (CARDIZEM ) 30 MG tablet, Take 1 tablet every 4 hours AS NEEDED for AFIB heart rate >100 as long as top BP >100., Disp: 30 tablet, Rfl: 1   losartan (COZAAR) 100 MG tablet, Take 100 mg by mouth daily., Disp: , Rfl:    metoprolol  succinate (TOPROL -XL) 100 MG 24 hr tablet, Take 100 mg by mouth daily. Take with or immediately following a meal., Disp: , Rfl:    Multiple Vitamin (MULTIVITAMIN) tablet, Take 1 tablet by mouth daily., Disp: , Rfl:    nicotine  (NICODERM CQ  - DOSED IN MG/24 HOURS) 14 mg/24hr patch, Place 1 patch (14 mg total) onto the skin daily., Disp: 28 patch, Rfl: 0   omeprazole  (PRILOSEC) 40 MG capsule, Take 1 capsule (40 mg total) by mouth daily., Disp: 90 capsule, Rfl: 0  Observations/Objective: Patient is well-developed, well-nourished in no acute distress.  Resting comfortably  at home.  Head is normocephalic, atraumatic.  No labored breathing.  Speech is clear and coherent with logical content.  Patient is alert and oriented at baseline.  Dry nonproductive cough  Assessment and Plan: 1. Acute bacterial bronchitis (Primary) - azithromycin  (ZITHROMAX ) 250 MG tablet; Take 500 mg once, then 250 mg for four days  Dispense: 6 tablet; Refill: 0 - predniSONE  (STERAPRED UNI-PAK 21 TAB) 10 MG (21) TBPK tablet; Use as directed  Dispense: 21 tablet; Refill: 0 - albuterol  (VENTOLIN  HFA) 108 (90 Base) MCG/ACT inhaler; Inhale 2 puffs into the lungs every 6 (six) hours as needed for wheezing or shortness of breath.  Dispense: 8 g; Refill: 0  2. Current smoker  - Take meds as prescribed - Use a cool mist humidifier  -Use saline nose sprays frequently -Force fluids -For any cough or congestion  Use plain Mucinex- regular strength or max strength is fine -For fever or aces or pains- take tylenol  or ibuprofen . -Throat lozenges if help -Smoking cessation discussed   Follow up if symptoms worsen  Follow Up Instructions: I discussed the assessment and treatment plan with the patient. The patient was provided an opportunity to ask questions and all were answered. The patient agreed with the plan and demonstrated an understanding of the instructions.  A copy of instructions were sent to the patient via MyChart unless otherwise noted below.     The patient was advised to call back or seek an in-person evaluation if the symptoms worsen or if the condition fails to improve as anticipated.    Bari Learn, FNP
